# Patient Record
Sex: Female | Born: 1935 | Race: Black or African American | Hispanic: No | Marital: Married | Smoking: Never smoker
Health system: Southern US, Community
[De-identification: ages and names within clinical notes are randomized; demographics above are authoritative.]

## PROBLEM LIST (undated history)

## (undated) DIAGNOSIS — Z8679 Personal history of other diseases of the circulatory system: Principal | ICD-10-CM

## (undated) DIAGNOSIS — F329 Major depressive disorder, single episode, unspecified: Secondary | ICD-10-CM

## (undated) DIAGNOSIS — M7062 Trochanteric bursitis, left hip: Secondary | ICD-10-CM

## (undated) DIAGNOSIS — N189 Chronic kidney disease, unspecified: Secondary | ICD-10-CM

## (undated) DIAGNOSIS — M62838 Other muscle spasm: Secondary | ICD-10-CM

## (undated) DIAGNOSIS — I739 Peripheral vascular disease, unspecified: Secondary | ICD-10-CM

## (undated) DIAGNOSIS — E2749 Other adrenocortical insufficiency: Secondary | ICD-10-CM

## (undated) DIAGNOSIS — F32A Depression, unspecified: Secondary | ICD-10-CM

## (undated) DIAGNOSIS — G5600 Carpal tunnel syndrome, unspecified upper limb: Secondary | ICD-10-CM

## (undated) DIAGNOSIS — D649 Anemia, unspecified: Secondary | ICD-10-CM

## (undated) DIAGNOSIS — A048 Other specified bacterial intestinal infections: Secondary | ICD-10-CM

## (undated) DIAGNOSIS — I10 Essential (primary) hypertension: Secondary | ICD-10-CM

## (undated) DIAGNOSIS — M1990 Unspecified osteoarthritis, unspecified site: Secondary | ICD-10-CM

## (undated) DIAGNOSIS — E119 Type 2 diabetes mellitus without complications: Secondary | ICD-10-CM

## (undated) DIAGNOSIS — L89611 Pressure ulcer of right heel, stage 1: Secondary | ICD-10-CM

## (undated) HISTORY — DX: Other specified bacterial intestinal infections: A04.8

## (undated) HISTORY — DX: Type 2 diabetes mellitus without complications: E11.9

## (undated) HISTORY — PX: TUBAL LIGATION: SHX77

## (undated) HISTORY — DX: Chronic kidney disease, unspecified: N18.9

## (undated) HISTORY — DX: Other muscle spasm: M62.838

## (undated) HISTORY — DX: Pressure ulcer of right heel, stage 1: L89.611

## (undated) HISTORY — DX: Personal history of other diseases of the circulatory system: Z86.79

## (undated) HISTORY — DX: Peripheral vascular disease, unspecified: I73.9

## (undated) HISTORY — DX: Unspecified osteoarthritis, unspecified site: M19.90

## (undated) HISTORY — DX: Other adrenocortical insufficiency: E27.49

## (undated) HISTORY — DX: Trochanteric bursitis, left hip: M70.62

## (undated) HISTORY — DX: Essential (primary) hypertension: I10

## (undated) HISTORY — DX: Carpal tunnel syndrome, unspecified upper limb: G56.00

## (undated) HISTORY — DX: Major depressive disorder, single episode, unspecified: F32.9

## (undated) HISTORY — DX: Anemia, unspecified: D64.9

## (undated) HISTORY — DX: Depression, unspecified: F32.A

## (undated) HISTORY — PX: VAGINAL HYSTERECTOMY: SUR661

## (undated) HISTORY — PX: OTHER SURGICAL HISTORY: SHX169

---
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
## 1988-03-28 HISTORY — PX: CATARACT EXTRACTION, BILATERAL: SHX1313

## 2003-02-26 DIAGNOSIS — A048 Other specified bacterial intestinal infections: Secondary | ICD-10-CM

## 2003-02-26 HISTORY — DX: Other specified bacterial intestinal infections: A04.8

## 2003-03-19 ENCOUNTER — Emergency Department (HOSPITAL_COMMUNITY): Admission: EM | Admit: 2003-03-19 | Discharge: 2003-03-19 | Payer: Self-pay | Admitting: Emergency Medicine

## 2003-03-29 ENCOUNTER — Emergency Department (HOSPITAL_COMMUNITY): Admission: EM | Admit: 2003-03-29 | Discharge: 2003-03-29 | Payer: Self-pay | Admitting: Emergency Medicine

## 2003-03-29 HISTORY — PX: CARDIAC CATHETERIZATION: SHX172

## 2003-04-06 ENCOUNTER — Emergency Department (HOSPITAL_COMMUNITY): Admission: EM | Admit: 2003-04-06 | Discharge: 2003-04-06 | Payer: Self-pay | Admitting: Emergency Medicine

## 2003-04-17 ENCOUNTER — Inpatient Hospital Stay (HOSPITAL_COMMUNITY): Admission: EM | Admit: 2003-04-17 | Discharge: 2003-04-17 | Payer: Self-pay | Admitting: Emergency Medicine

## 2003-05-04 ENCOUNTER — Emergency Department (HOSPITAL_COMMUNITY): Admission: EM | Admit: 2003-05-04 | Discharge: 2003-05-04 | Payer: Self-pay | Admitting: Emergency Medicine

## 2003-05-27 HISTORY — PX: CARDIAC PACEMAKER PLACEMENT: SHX583

## 2003-05-30 ENCOUNTER — Ambulatory Visit (HOSPITAL_COMMUNITY): Admission: RE | Admit: 2003-05-30 | Discharge: 2003-05-30 | Payer: Self-pay | Admitting: Gastroenterology

## 2003-06-03 ENCOUNTER — Other Ambulatory Visit: Admission: RE | Admit: 2003-06-03 | Discharge: 2003-06-03 | Payer: Self-pay | Admitting: Obstetrics and Gynecology

## 2003-06-12 ENCOUNTER — Encounter: Admission: RE | Admit: 2003-06-12 | Discharge: 2003-06-12 | Payer: Self-pay | Admitting: *Deleted

## 2003-06-17 ENCOUNTER — Inpatient Hospital Stay (HOSPITAL_COMMUNITY): Admission: EM | Admit: 2003-06-17 | Discharge: 2003-06-19 | Payer: Self-pay | Admitting: Emergency Medicine

## 2003-07-27 HISTORY — PX: OOPHORECTOMY: SHX86

## 2003-08-05 ENCOUNTER — Encounter (INDEPENDENT_AMBULATORY_CARE_PROVIDER_SITE_OTHER): Payer: Self-pay | Admitting: *Deleted

## 2003-08-05 ENCOUNTER — Inpatient Hospital Stay (HOSPITAL_COMMUNITY): Admission: RE | Admit: 2003-08-05 | Discharge: 2003-08-07 | Payer: Self-pay | Admitting: Obstetrics and Gynecology

## 2004-03-28 ENCOUNTER — Encounter: Payer: Self-pay | Admitting: Family Medicine

## 2004-03-28 LAB — CONVERTED CEMR LAB

## 2004-05-15 ENCOUNTER — Inpatient Hospital Stay (HOSPITAL_COMMUNITY): Admission: EM | Admit: 2004-05-15 | Discharge: 2004-05-16 | Payer: Self-pay | Admitting: Emergency Medicine

## 2004-07-01 IMAGING — CR DG ABDOMEN ACUTE W/ 1V CHEST
4 series · 4 of 4 positions shown · non-contrast
Comparison: Chest 04/17/2003.

CLINICAL DATA: Abdominal pain, diarrhea. 
 ACUTE ABDOMINAL SERIES WITH CHEST

[view not recorded (1 of 4)]
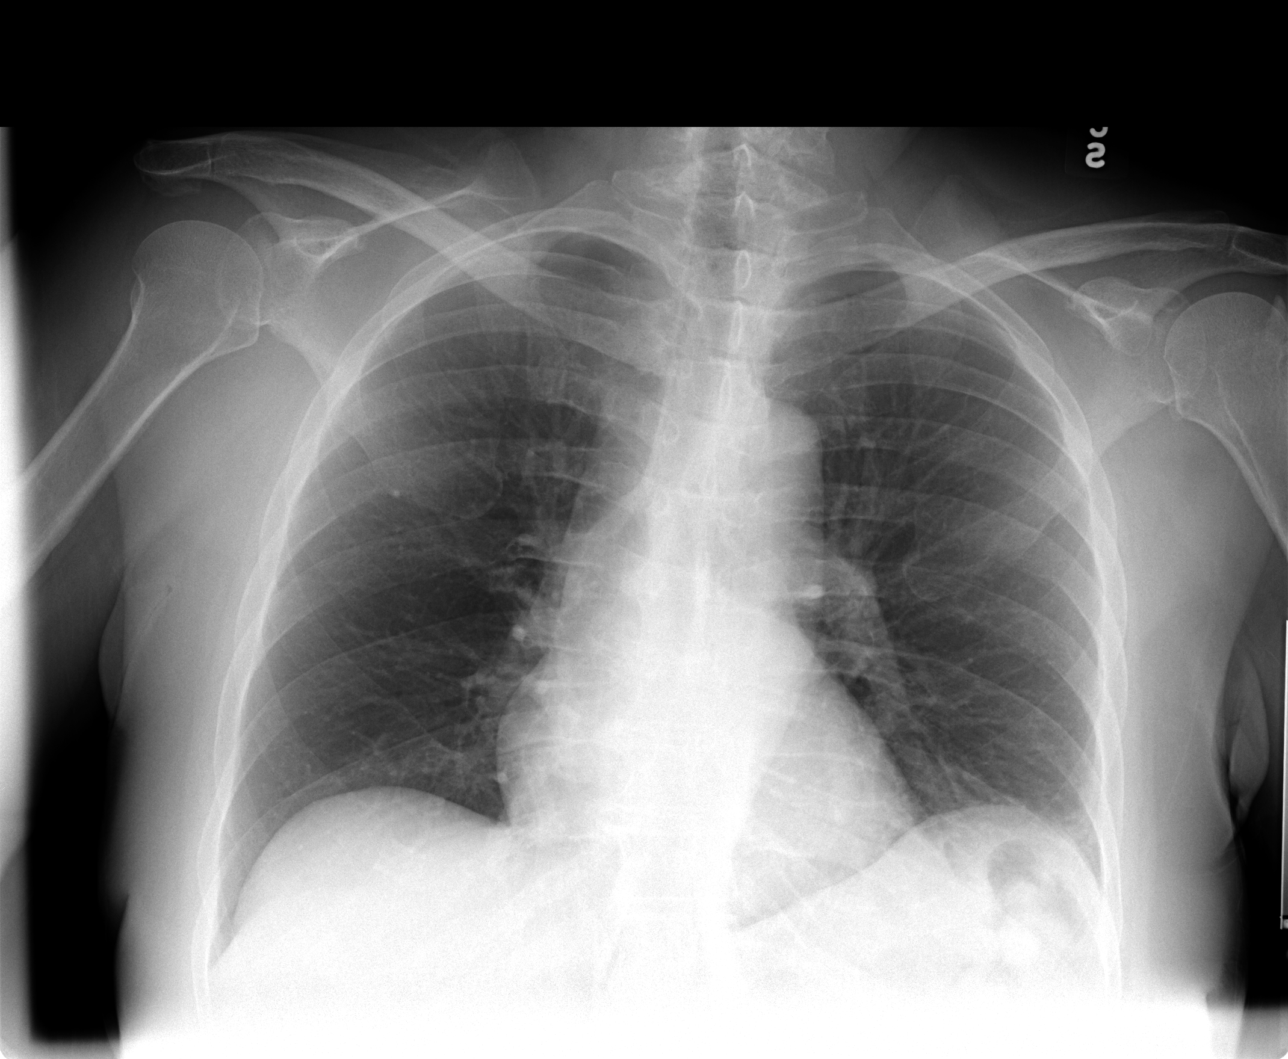

[view not recorded (2 of 4)]
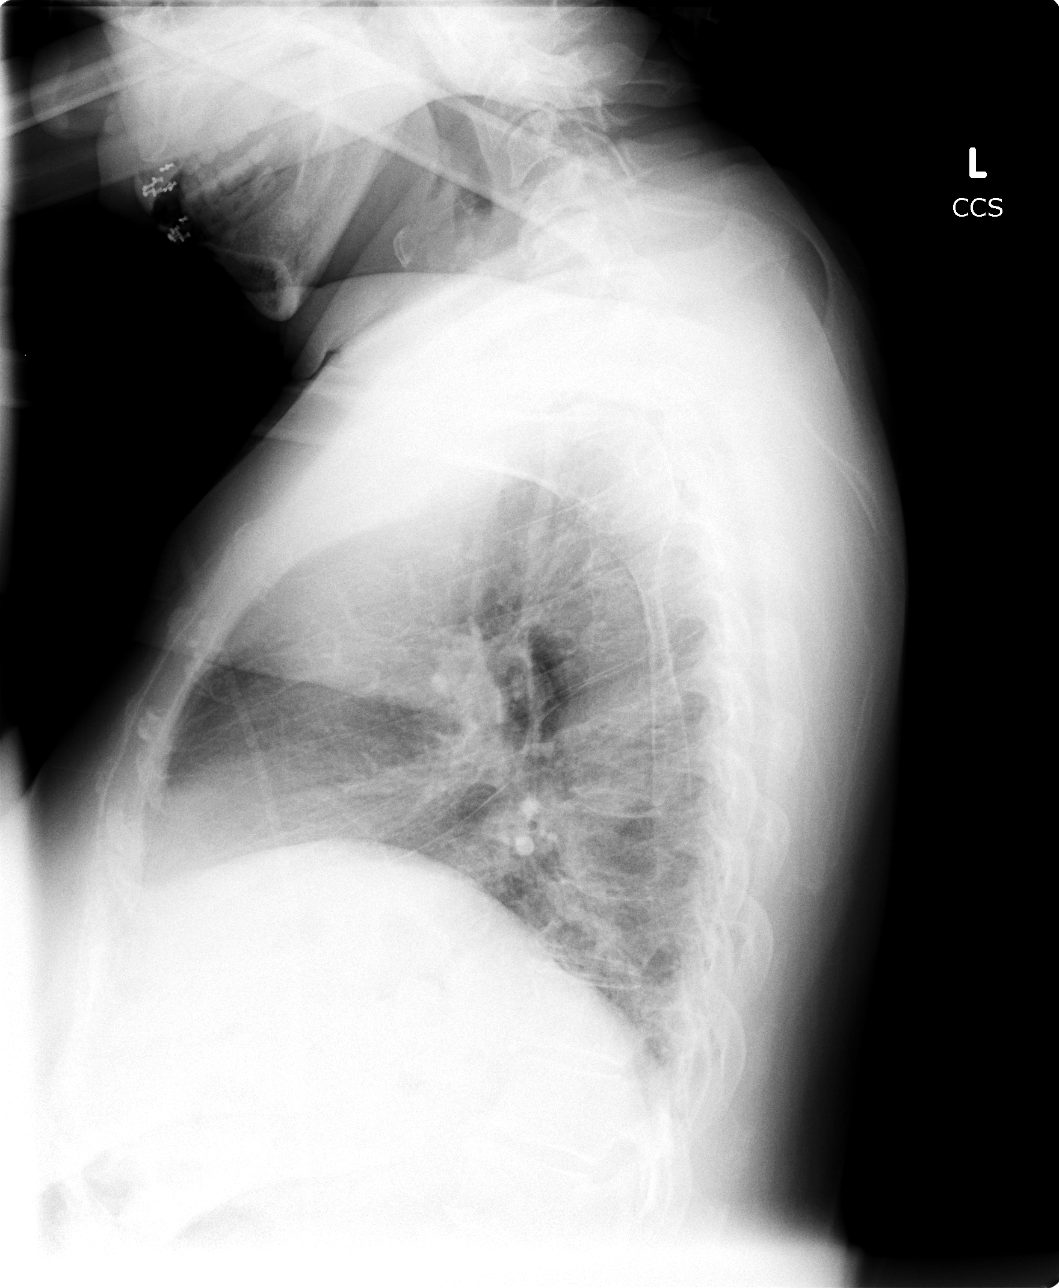

[view not recorded (3 of 4)]
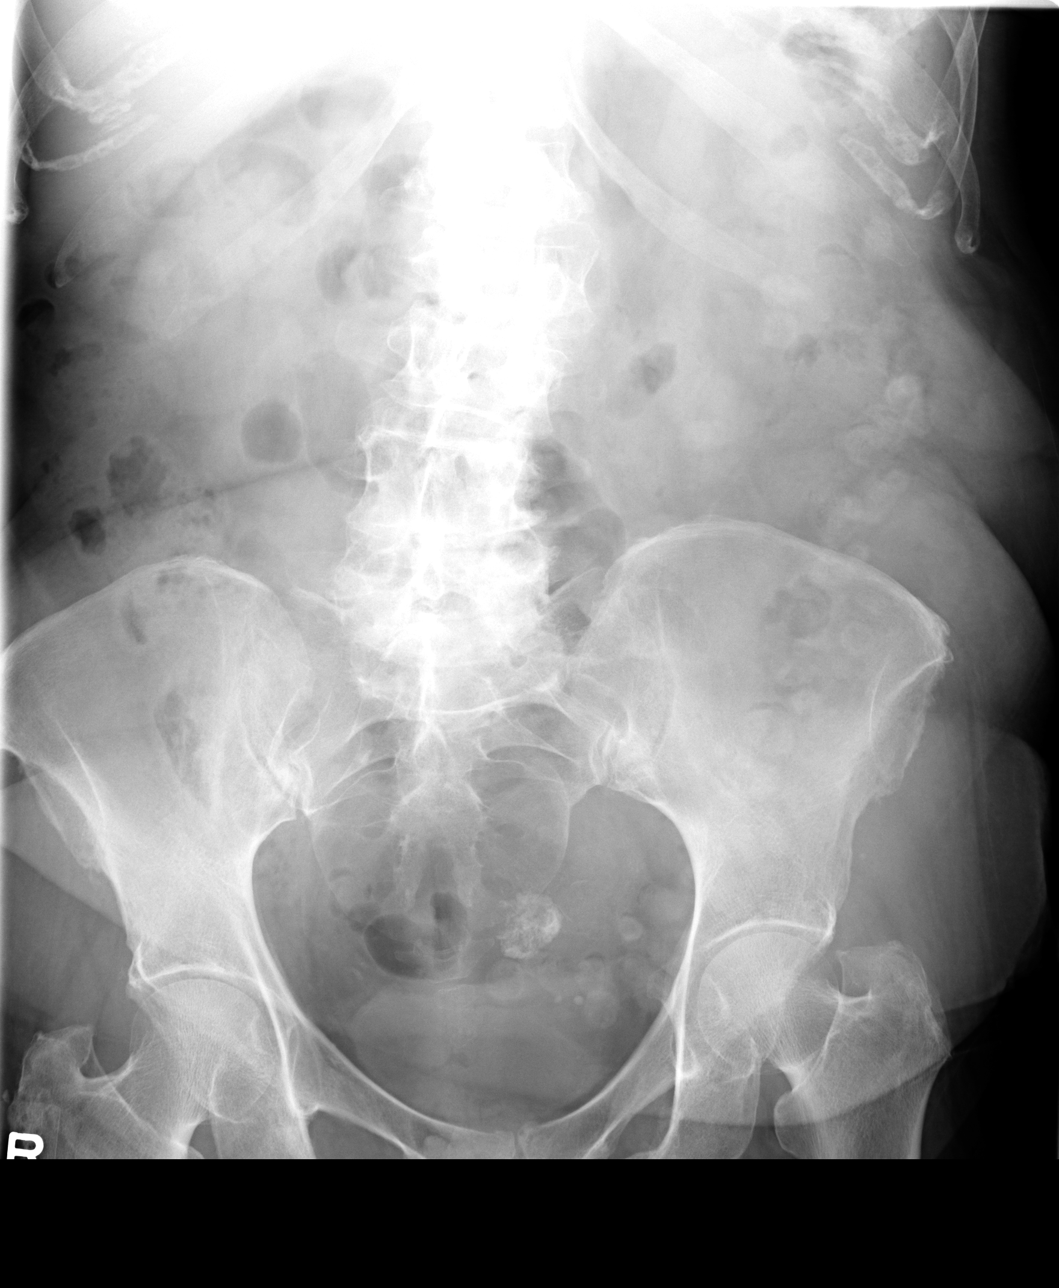

[view not recorded (4 of 4)]
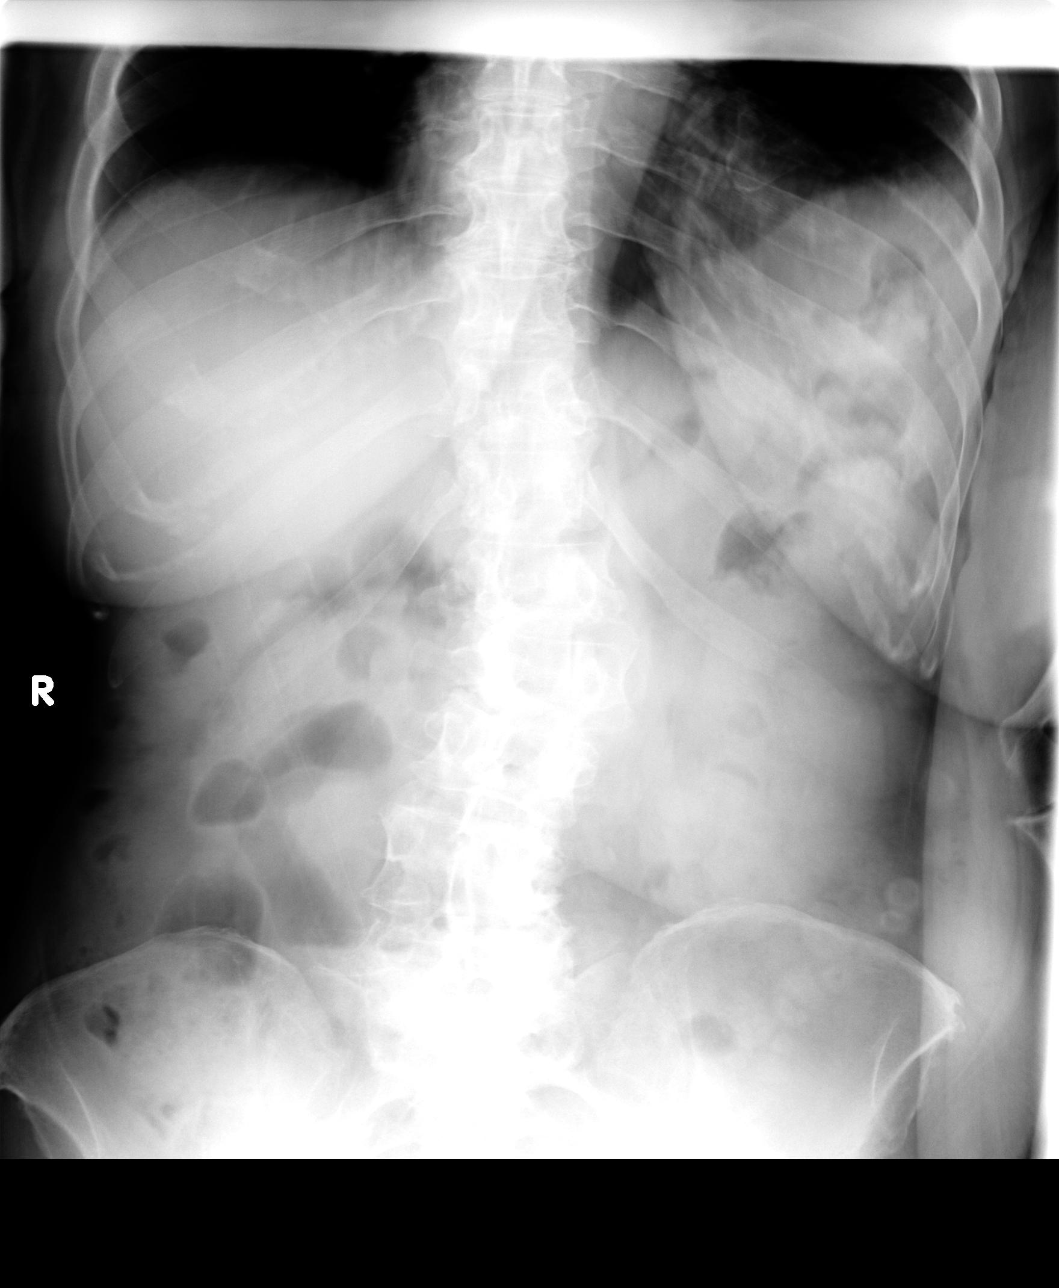

[4 of 4 positions shown; findings below may reference images not displayed]

Heart and mediastinal contours are within normal limits.  Minimal left basilar atelectasis is present.  
 There is a nonobstructive bowel gas pattern.   No free air.  Probable calcified fibroids in the pelvis.  Degenerative changes and scoliosis in the lumbar spine.  
 IMPRESSION
 No evidence of obstruction or free air. 
 Minimal left base atelectasis.

## 2004-08-14 IMAGING — CR DG CHEST 1V PORT
1 series · 1 of 1 positions shown · non-contrast
Comparison: none

CLINICAL DATA: Chest pain.  
 PORTABLE CHEST 
 Comparison 06/12/03:
 Heart and lungs normal in one view.  
 IMPRESSION
 No active disease.

[view not recorded]
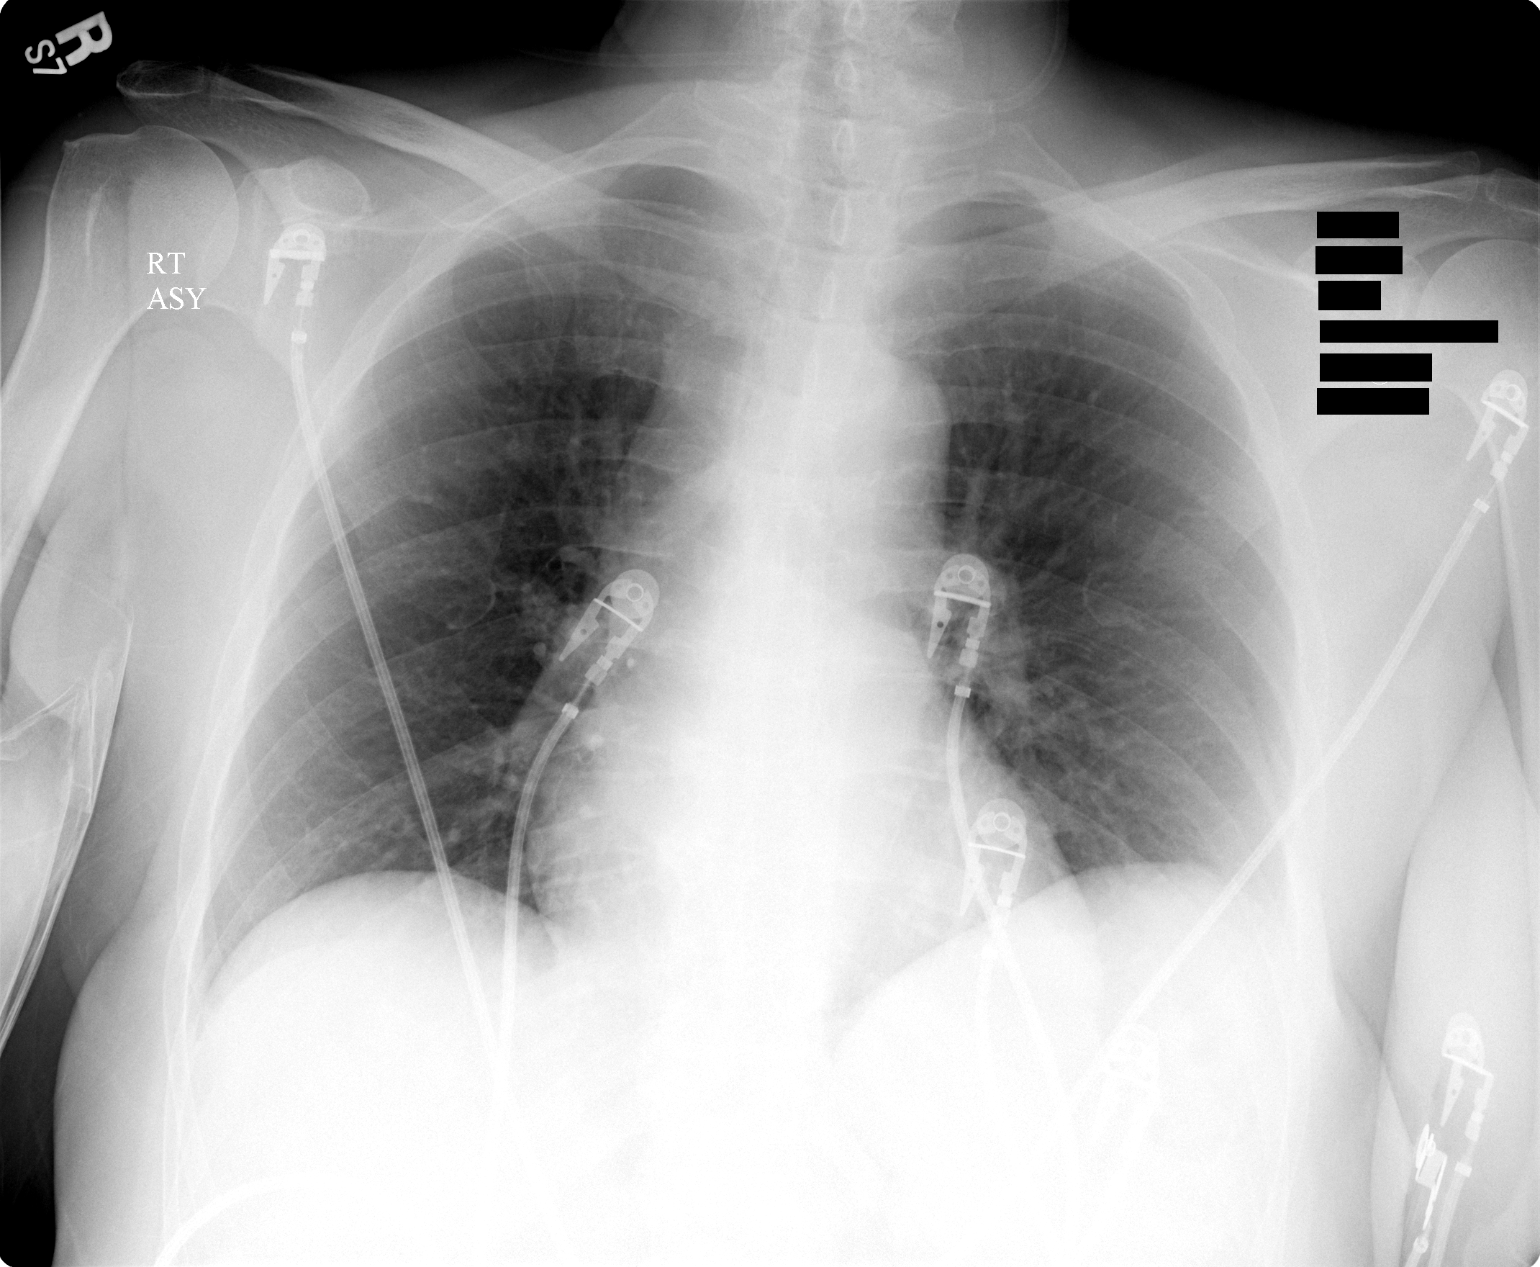

[1 of 1 positions shown; findings below may reference images not displayed]

## 2005-02-01 ENCOUNTER — Emergency Department (HOSPITAL_COMMUNITY): Admission: EM | Admit: 2005-02-01 | Discharge: 2005-02-01 | Payer: Self-pay | Admitting: Emergency Medicine

## 2005-03-09 ENCOUNTER — Emergency Department (HOSPITAL_COMMUNITY): Admission: EM | Admit: 2005-03-09 | Discharge: 2005-03-09 | Payer: Self-pay | Admitting: Emergency Medicine

## 2005-03-15 ENCOUNTER — Emergency Department (HOSPITAL_COMMUNITY): Admission: EM | Admit: 2005-03-15 | Discharge: 2005-03-15 | Payer: Self-pay | Admitting: Emergency Medicine

## 2005-05-19 ENCOUNTER — Encounter (INDEPENDENT_AMBULATORY_CARE_PROVIDER_SITE_OTHER): Payer: Self-pay | Admitting: Specialist

## 2005-05-19 ENCOUNTER — Ambulatory Visit (HOSPITAL_COMMUNITY): Admission: RE | Admit: 2005-05-19 | Discharge: 2005-05-19 | Payer: Self-pay | Admitting: Gastroenterology

## 2005-12-08 ENCOUNTER — Emergency Department (HOSPITAL_COMMUNITY): Admission: EM | Admit: 2005-12-08 | Discharge: 2005-12-08 | Payer: Self-pay | Admitting: Emergency Medicine

## 2006-06-08 ENCOUNTER — Inpatient Hospital Stay (HOSPITAL_COMMUNITY): Admission: EM | Admit: 2006-06-08 | Discharge: 2006-06-14 | Payer: Self-pay | Admitting: Emergency Medicine

## 2006-07-19 ENCOUNTER — Encounter: Payer: Self-pay | Admitting: Family Medicine

## 2006-07-19 ENCOUNTER — Ambulatory Visit: Payer: Self-pay | Admitting: Family Medicine

## 2006-07-19 DIAGNOSIS — I10 Essential (primary) hypertension: Secondary | ICD-10-CM

## 2006-07-19 DIAGNOSIS — M1710 Unilateral primary osteoarthritis, unspecified knee: Secondary | ICD-10-CM

## 2006-07-19 DIAGNOSIS — G309 Alzheimer's disease, unspecified: Secondary | ICD-10-CM

## 2006-07-19 DIAGNOSIS — F0280 Dementia in other diseases classified elsewhere without behavioral disturbance: Secondary | ICD-10-CM

## 2006-07-19 LAB — CONVERTED CEMR LAB
Albumin: 4.1 g/dL (ref 3.5–5.2)
BUN: 22 mg/dL (ref 6–23)
CO2: 29 meq/L (ref 19–32)
Calcium: 10 mg/dL (ref 8.4–10.5)
Chloride: 101 meq/L (ref 96–112)
Creatinine, Ser: 1.06 mg/dL (ref 0.40–1.20)
Glucose, Bld: 154 mg/dL — ABNORMAL HIGH (ref 70–99)
HCT: 37.8 %
Hemoglobin: 12.4 g/dL
Hgb A1c MFr Bld: 7 %
Potassium: 3.8 meq/L (ref 3.5–5.3)
WBC: 8.6 10*3/uL

## 2006-07-20 ENCOUNTER — Telehealth: Payer: Self-pay | Admitting: Family Medicine

## 2006-07-27 HISTORY — PX: TRABECULECTOMY: SHX107

## 2006-08-16 ENCOUNTER — Ambulatory Visit: Payer: Self-pay | Admitting: Family Medicine

## 2006-08-18 ENCOUNTER — Encounter: Payer: Self-pay | Admitting: Family Medicine

## 2006-08-18 LAB — CONVERTED CEMR LAB

## 2006-08-22 ENCOUNTER — Telehealth: Payer: Self-pay | Admitting: Family Medicine

## 2006-08-22 ENCOUNTER — Encounter: Payer: Self-pay | Admitting: Family Medicine

## 2006-08-23 ENCOUNTER — Encounter: Payer: Self-pay | Admitting: Family Medicine

## 2006-09-19 ENCOUNTER — Ambulatory Visit: Payer: Self-pay | Admitting: Family Medicine

## 2006-09-19 DIAGNOSIS — H409 Unspecified glaucoma: Secondary | ICD-10-CM | POA: Insufficient documentation

## 2006-09-19 LAB — CONVERTED CEMR LAB: Hgb A1c MFr Bld: 7.2 %

## 2006-10-02 ENCOUNTER — Telehealth (INDEPENDENT_AMBULATORY_CARE_PROVIDER_SITE_OTHER): Payer: Self-pay | Admitting: Family Medicine

## 2006-10-05 ENCOUNTER — Telehealth: Payer: Self-pay | Admitting: *Deleted

## 2006-10-06 ENCOUNTER — Encounter: Payer: Self-pay | Admitting: *Deleted

## 2006-10-06 ENCOUNTER — Ambulatory Visit: Payer: Self-pay | Admitting: Family Medicine

## 2006-10-06 LAB — CONVERTED CEMR LAB
CO2: 25 meq/L (ref 19–32)
Calcium: 9.6 mg/dL (ref 8.4–10.5)
Creatinine, Ser: 1.06 mg/dL (ref 0.40–1.20)
Sodium: 137 meq/L (ref 135–145)

## 2006-10-17 ENCOUNTER — Ambulatory Visit: Payer: Self-pay | Admitting: Family Medicine

## 2007-01-02 ENCOUNTER — Ambulatory Visit: Payer: Self-pay | Admitting: Family Medicine

## 2007-01-02 DIAGNOSIS — G5620 Lesion of ulnar nerve, unspecified upper limb: Secondary | ICD-10-CM

## 2007-01-02 LAB — CONVERTED CEMR LAB: Hgb A1c MFr Bld: 7 %

## 2007-01-09 ENCOUNTER — Telehealth (INDEPENDENT_AMBULATORY_CARE_PROVIDER_SITE_OTHER): Payer: Self-pay | Admitting: Family Medicine

## 2007-01-18 ENCOUNTER — Encounter: Admission: RE | Admit: 2007-01-18 | Discharge: 2007-02-08 | Payer: Self-pay | Admitting: Family Medicine

## 2007-02-08 ENCOUNTER — Encounter: Payer: Self-pay | Admitting: Family Medicine

## 2007-03-13 ENCOUNTER — Ambulatory Visit: Payer: Self-pay

## 2007-03-23 ENCOUNTER — Encounter: Payer: Self-pay | Admitting: Family Medicine

## 2007-03-23 ENCOUNTER — Ambulatory Visit: Payer: Self-pay | Admitting: Family Medicine

## 2007-03-23 LAB — CONVERTED CEMR LAB
BUN: 17 mg/dL (ref 6–23)
Chloride: 102 meq/L (ref 96–112)
Potassium: 4 meq/L (ref 3.5–5.3)
Pro B Natriuretic peptide (BNP): 9 pg/mL (ref 0.0–100.0)

## 2007-03-26 ENCOUNTER — Encounter: Payer: Self-pay | Admitting: Family Medicine

## 2007-06-06 ENCOUNTER — Encounter: Payer: Self-pay | Admitting: Family Medicine

## 2007-06-12 ENCOUNTER — Encounter: Payer: Self-pay | Admitting: Family Medicine

## 2007-08-14 ENCOUNTER — Encounter: Payer: Self-pay | Admitting: Family Medicine

## 2007-08-28 ENCOUNTER — Ambulatory Visit: Payer: Self-pay | Admitting: Family Medicine

## 2007-09-25 ENCOUNTER — Telehealth: Payer: Self-pay | Admitting: Family Medicine

## 2007-10-16 ENCOUNTER — Ambulatory Visit: Payer: Self-pay | Admitting: Family Medicine

## 2007-10-16 LAB — CONVERTED CEMR LAB
ALT: 11 units/L (ref 0–35)
AST: 13 units/L (ref 0–37)
Albumin: 4.1 g/dL (ref 3.5–5.2)
BUN: 27 mg/dL — ABNORMAL HIGH (ref 6–23)
CO2: 26 meq/L (ref 19–32)
Calcium: 9.6 mg/dL (ref 8.4–10.5)
Chloride: 103 meq/L (ref 96–112)
HCT: 36.4 % (ref 36.0–46.0)
Hemoglobin: 11 g/dL — ABNORMAL LOW (ref 12.0–15.0)
Hgb A1c MFr Bld: 8.3 %
Platelets: 254 10*3/uL (ref 150–400)
Potassium: 4 meq/L (ref 3.5–5.3)
RDW: 17.1 % — ABNORMAL HIGH (ref 11.5–15.5)
TSH: 4.418 microintl units/mL (ref 0.350–4.50)
WBC: 7.9 10*3/uL (ref 4.0–10.5)

## 2007-10-17 ENCOUNTER — Encounter: Payer: Self-pay | Admitting: Family Medicine

## 2007-11-26 ENCOUNTER — Telehealth: Payer: Self-pay | Admitting: Family Medicine

## 2007-12-03 ENCOUNTER — Encounter: Payer: Self-pay | Admitting: Family Medicine

## 2007-12-04 ENCOUNTER — Ambulatory Visit: Payer: Self-pay | Admitting: Family Medicine

## 2007-12-04 DIAGNOSIS — D509 Iron deficiency anemia, unspecified: Secondary | ICD-10-CM

## 2007-12-04 LAB — CONVERTED CEMR LAB
HCT: 34.7 % — ABNORMAL LOW (ref 36.0–46.0)
MCHC: 30.5 g/dL (ref 30.0–36.0)
MCV: 72.1 fL — ABNORMAL LOW (ref 78.0–100.0)
RBC: 4.81 M/uL (ref 3.87–5.11)
WBC: 7.5 10*3/uL (ref 4.0–10.5)

## 2007-12-05 ENCOUNTER — Encounter: Payer: Self-pay | Admitting: Family Medicine

## 2008-01-29 ENCOUNTER — Telehealth: Payer: Self-pay | Admitting: Family Medicine

## 2008-02-07 ENCOUNTER — Encounter (INDEPENDENT_AMBULATORY_CARE_PROVIDER_SITE_OTHER): Payer: Self-pay | Admitting: *Deleted

## 2008-02-12 ENCOUNTER — Ambulatory Visit: Payer: Self-pay | Admitting: Family Medicine

## 2008-02-12 LAB — CONVERTED CEMR LAB
AST: 16 units/L (ref 0–37)
Albumin: 4.3 g/dL (ref 3.5–5.2)
BUN: 19 mg/dL (ref 6–23)
CO2: 25 meq/L (ref 19–32)
Calcium: 10 mg/dL (ref 8.4–10.5)
Chloride: 104 meq/L (ref 96–112)
Glucose, Bld: 136 mg/dL — ABNORMAL HIGH (ref 70–99)
Hemoglobin: 10.6 g/dL — ABNORMAL LOW (ref 12.0–15.0)
Potassium: 3.5 meq/L (ref 3.5–5.3)
RBC: 4.66 M/uL (ref 3.87–5.11)
WBC: 6.2 10*3/uL (ref 4.0–10.5)

## 2008-02-26 ENCOUNTER — Encounter: Payer: Self-pay | Admitting: Family Medicine

## 2008-03-03 ENCOUNTER — Telehealth: Payer: Self-pay | Admitting: Family Medicine

## 2008-04-10 ENCOUNTER — Encounter: Payer: Self-pay | Admitting: Family Medicine

## 2008-04-22 ENCOUNTER — Ambulatory Visit: Payer: Self-pay | Admitting: Family Medicine

## 2008-06-02 ENCOUNTER — Ambulatory Visit (HOSPITAL_COMMUNITY): Admission: RE | Admit: 2008-06-02 | Discharge: 2008-06-02 | Payer: Self-pay | Admitting: Family Medicine

## 2008-06-03 ENCOUNTER — Ambulatory Visit: Payer: Self-pay | Admitting: Family Medicine

## 2008-06-03 LAB — CONVERTED CEMR LAB: Glucose, Bld: 123 mg/dL

## 2008-06-06 LAB — CONVERTED CEMR LAB
CO2: 24 meq/L (ref 19–32)
Calcium: 10.1 mg/dL (ref 8.4–10.5)
Creatinine, Ser: 2.07 mg/dL — ABNORMAL HIGH (ref 0.40–1.20)

## 2008-06-09 ENCOUNTER — Telehealth: Payer: Self-pay | Admitting: Family Medicine

## 2008-06-12 ENCOUNTER — Ambulatory Visit: Payer: Self-pay | Admitting: Family Medicine

## 2008-06-12 ENCOUNTER — Encounter: Admission: RE | Admit: 2008-06-12 | Discharge: 2008-06-12 | Payer: Self-pay | Admitting: Family Medicine

## 2008-06-12 LAB — CONVERTED CEMR LAB
BUN: 12 mg/dL (ref 6–23)
Chloride: 109 meq/L (ref 96–112)
Hemoglobin: 10.3 g/dL — ABNORMAL LOW (ref 12.0–15.0)
Platelets: 201 10*3/uL (ref 150–400)
Potassium: 3.8 meq/L (ref 3.5–5.3)
RDW: 15.4 % (ref 11.5–15.5)

## 2008-06-17 ENCOUNTER — Telehealth: Payer: Self-pay | Admitting: Family Medicine

## 2008-06-25 ENCOUNTER — Telehealth: Payer: Self-pay | Admitting: Family Medicine

## 2008-07-15 ENCOUNTER — Ambulatory Visit: Payer: Self-pay | Admitting: Family Medicine

## 2008-07-15 DIAGNOSIS — G20 Parkinson's disease: Secondary | ICD-10-CM

## 2008-07-29 ENCOUNTER — Telehealth: Payer: Self-pay | Admitting: Family Medicine

## 2008-08-19 ENCOUNTER — Ambulatory Visit: Payer: Self-pay | Admitting: Family Medicine

## 2008-08-19 ENCOUNTER — Encounter: Payer: Self-pay | Admitting: Family Medicine

## 2008-08-19 ENCOUNTER — Inpatient Hospital Stay (HOSPITAL_COMMUNITY): Admission: EM | Admit: 2008-08-19 | Discharge: 2008-08-22 | Payer: Self-pay | Admitting: Emergency Medicine

## 2008-08-22 ENCOUNTER — Encounter: Payer: Self-pay | Admitting: Family Medicine

## 2008-08-23 ENCOUNTER — Emergency Department (HOSPITAL_COMMUNITY): Admission: EM | Admit: 2008-08-23 | Discharge: 2008-08-23 | Payer: Self-pay | Admitting: Emergency Medicine

## 2008-08-23 ENCOUNTER — Telehealth: Payer: Self-pay | Admitting: Family Medicine

## 2008-09-03 ENCOUNTER — Encounter: Payer: Self-pay | Admitting: Family Medicine

## 2008-09-17 ENCOUNTER — Encounter: Payer: Self-pay | Admitting: Family Medicine

## 2008-09-23 ENCOUNTER — Encounter: Payer: Self-pay | Admitting: Family Medicine

## 2008-09-24 ENCOUNTER — Encounter: Payer: Self-pay | Admitting: Family Medicine

## 2008-10-04 ENCOUNTER — Telehealth: Payer: Self-pay | Admitting: Sports Medicine

## 2008-10-15 DIAGNOSIS — Z593 Problems related to living in residential institution: Secondary | ICD-10-CM

## 2008-10-18 ENCOUNTER — Encounter: Payer: Self-pay | Admitting: Family Medicine

## 2008-10-29 ENCOUNTER — Telehealth: Payer: Self-pay | Admitting: Family Medicine

## 2008-11-12 ENCOUNTER — Ambulatory Visit: Payer: Self-pay | Admitting: Family Medicine

## 2008-11-12 ENCOUNTER — Encounter: Payer: Self-pay | Admitting: Family Medicine

## 2008-11-17 ENCOUNTER — Encounter (INDEPENDENT_AMBULATORY_CARE_PROVIDER_SITE_OTHER): Payer: Self-pay | Admitting: Pharmacist

## 2008-11-18 ENCOUNTER — Encounter: Payer: Self-pay | Admitting: Family Medicine

## 2008-11-18 LAB — CONVERTED CEMR LAB: Hgb A1c MFr Bld: 7 %

## 2008-11-19 ENCOUNTER — Encounter: Payer: Self-pay | Admitting: Family Medicine

## 2008-12-24 ENCOUNTER — Encounter: Payer: Self-pay | Admitting: Family Medicine

## 2009-01-14 ENCOUNTER — Ambulatory Visit: Payer: Self-pay | Admitting: Family Medicine

## 2009-02-11 ENCOUNTER — Encounter (INDEPENDENT_AMBULATORY_CARE_PROVIDER_SITE_OTHER): Payer: Self-pay | Admitting: Pharmacist

## 2009-02-23 ENCOUNTER — Encounter: Payer: Self-pay | Admitting: Family Medicine

## 2009-02-27 ENCOUNTER — Encounter: Payer: Self-pay | Admitting: Family Medicine

## 2009-02-27 LAB — CONVERTED CEMR LAB: Hgb A1c MFr Bld: 5.4 %

## 2009-03-12 ENCOUNTER — Encounter: Payer: Self-pay | Admitting: Family Medicine

## 2009-03-12 LAB — CONVERTED CEMR LAB
Hemoglobin: 9.5 g/dL
Hgb A1c MFr Bld: 5.4 %

## 2009-04-15 ENCOUNTER — Encounter (INDEPENDENT_AMBULATORY_CARE_PROVIDER_SITE_OTHER): Payer: Self-pay | Admitting: Pharmacist

## 2009-04-29 ENCOUNTER — Ambulatory Visit: Payer: Self-pay | Admitting: Family Medicine

## 2009-04-29 ENCOUNTER — Encounter: Payer: Self-pay | Admitting: Family Medicine

## 2009-05-04 ENCOUNTER — Encounter: Payer: Self-pay | Admitting: Family Medicine

## 2009-05-04 DIAGNOSIS — R634 Abnormal weight loss: Secondary | ICD-10-CM

## 2009-05-13 ENCOUNTER — Encounter (INDEPENDENT_AMBULATORY_CARE_PROVIDER_SITE_OTHER): Payer: Self-pay | Admitting: Pharmacist

## 2009-05-19 ENCOUNTER — Encounter: Payer: Self-pay | Admitting: Family Medicine

## 2009-05-19 DIAGNOSIS — E119 Type 2 diabetes mellitus without complications: Secondary | ICD-10-CM

## 2009-07-01 ENCOUNTER — Encounter: Payer: Self-pay | Admitting: Family Medicine

## 2009-07-21 ENCOUNTER — Encounter: Payer: Self-pay | Admitting: Family Medicine

## 2009-08-04 ENCOUNTER — Encounter: Payer: Self-pay | Admitting: Family Medicine

## 2009-08-12 ENCOUNTER — Encounter (INDEPENDENT_AMBULATORY_CARE_PROVIDER_SITE_OTHER): Payer: Self-pay | Admitting: Pharmacist

## 2009-08-28 ENCOUNTER — Encounter (INDEPENDENT_AMBULATORY_CARE_PROVIDER_SITE_OTHER): Payer: Self-pay | Admitting: Pharmacist

## 2009-08-28 LAB — CONVERTED CEMR LAB: Hgb A1c MFr Bld: 6 %

## 2009-09-16 ENCOUNTER — Ambulatory Visit: Payer: Self-pay | Admitting: Family Medicine

## 2009-09-16 ENCOUNTER — Encounter (INDEPENDENT_AMBULATORY_CARE_PROVIDER_SITE_OTHER): Payer: Self-pay | Admitting: Pharmacist

## 2009-09-18 ENCOUNTER — Encounter: Payer: Self-pay | Admitting: Family Medicine

## 2009-10-04 ENCOUNTER — Telehealth: Payer: Self-pay | Admitting: Family Medicine

## 2009-10-12 ENCOUNTER — Encounter: Payer: Self-pay | Admitting: Family Medicine

## 2009-10-20 ENCOUNTER — Encounter: Payer: Self-pay | Admitting: Family Medicine

## 2009-11-04 ENCOUNTER — Encounter: Payer: Self-pay | Admitting: Family Medicine

## 2009-11-19 ENCOUNTER — Encounter (INDEPENDENT_AMBULATORY_CARE_PROVIDER_SITE_OTHER): Payer: Self-pay | Admitting: Pharmacist

## 2009-11-25 ENCOUNTER — Encounter (INDEPENDENT_AMBULATORY_CARE_PROVIDER_SITE_OTHER): Payer: Self-pay | Admitting: Pharmacist

## 2009-12-24 ENCOUNTER — Encounter (INDEPENDENT_AMBULATORY_CARE_PROVIDER_SITE_OTHER): Payer: Self-pay | Admitting: Pharmacist

## 2009-12-25 ENCOUNTER — Encounter: Payer: Self-pay | Admitting: Family Medicine

## 2009-12-25 LAB — CONVERTED CEMR LAB: Hgb A1c MFr Bld: 6.1 %

## 2010-01-07 ENCOUNTER — Encounter: Payer: Self-pay | Admitting: Family Medicine

## 2010-01-07 LAB — CONVERTED CEMR LAB
Hemoglobin: 9.6 g/dL
Platelets: 217 10*3/uL

## 2010-01-20 ENCOUNTER — Encounter (INDEPENDENT_AMBULATORY_CARE_PROVIDER_SITE_OTHER): Payer: Self-pay | Admitting: Pharmacist

## 2010-01-20 ENCOUNTER — Ambulatory Visit: Payer: Self-pay | Admitting: Family Medicine

## 2010-01-20 DIAGNOSIS — M623 Immobility syndrome (paraplegic): Secondary | ICD-10-CM

## 2010-02-02 ENCOUNTER — Encounter: Payer: Self-pay | Admitting: Family Medicine

## 2010-02-16 ENCOUNTER — Telehealth: Payer: Self-pay | Admitting: Family Medicine

## 2010-03-03 ENCOUNTER — Encounter: Payer: Self-pay | Admitting: Family Medicine

## 2010-03-26 ENCOUNTER — Encounter: Payer: Self-pay | Admitting: Family Medicine

## 2010-04-18 ENCOUNTER — Encounter: Payer: Self-pay | Admitting: Family Medicine

## 2010-04-27 ENCOUNTER — Encounter: Payer: Self-pay | Admitting: Family Medicine

## 2010-04-27 NOTE — Miscellaneous (Signed)
Summary: Med Update - Rx  Clinical Lists Changes  Medications: Added new medication of METFORMIN HCL 1000 MG TABS (METFORMIN HCL) once daily - Signed Rx of METFORMIN HCL 1000 MG TABS (METFORMIN HCL) once daily;  #1 x 0;  Signed;  Entered by: Christian Mate D;  Authorized by: Madelon Lips Pharm D;  Method used: Historical    Prescriptions: METFORMIN HCL 1000 MG TABS (METFORMIN HCL) once daily  #1 x 0   Entered and Authorized by:   Christian Mate D   Signed by:   Madelon Lips Pharm D on 01/20/2010   Method used:   Historical   RxID:   [id number]

## 2010-04-27 NOTE — Miscellaneous (Signed)
Summary: Med Update - Rx  Clinical Lists Changes  Medications: Removed medication of CARTIA XT 240 MG  CP24 (DILTIAZEM HCL COATED BEADS) Take one tablet daily

## 2010-04-27 NOTE — Assessment & Plan Note (Signed)
Summary: NH visit   Primary Care Rande Roylance:  Jamie Brookes MD   History of Present Illness: No verbal response. No problems reported  Allergies: 1)  ! Penicillin 2)  Aricept (Donepezil Hcl) 3)  Norvasc  Physical Exam  General:  NAD, sitting in chair peacefully Lungs:  Normal respiratory effort, chest expands symmetrically. Lungs are clear to auscultation, no crackles or wheezes. Heart:  Normal rate and regular rhythm. S1 and S2 normal without gallop, murmur, click, rub or other extra sounds. Extremities:  No edema Neurologic:  After a time she opened her eyes. No verbal reponse. Masked facies. Rigid arms wearing hand splints.    Impression & Recommendations:  Problem # 1:  PARKINSONISM (ICD-332.0) poor response to treatment Orders: Gurnoor Ursua Misc Charge- [facility] (Misc)  Problem # 2:  ALZHEIMER'S DISEASE (ICD-331.0) Continues to decline Orders: Caylei Sperry Misc Charge- [facility] (Misc)  Problem # 3:  HYPERTENSION, BENIGN (ICD-401.1) Mostly well controlled. Her updated medication list for this problem includes:    Lisinopril 10 Mg Tabs (Lisinopril) ..... Once daily  Orders: Jondavid Schreier Misc Charge- [facility] (Misc)  Complete Medication List: 1)  Adult Aspirin Ec Low Strength 81 Mg Tbec (Aspirin) .Marland Kitchen.. 1 tab by mouth daily 2)  Miralax Powd (Polyethylene glycol 3350) .Marland Kitchen.. 17g in 8 oz water daily 3)  Tylenol 8 Hour 650 Mg Tbcr (Acetaminophen) .... Take 1 tabs twice daily 4)  Multivitamins Tabs (Multiple vitamin) .... One daily 5)  Timoptic-xe 0.5 % Solg (Timolol maleate) .... One drop both eys daily 6)  Xalatan 0.005 % Soln (Latanoprost) .Marland Kitchen.. 1 drop both eyes daily 7)  Sinemet 25-250 Mg Tabs (Carbidopa-levodopa) .... Three times a day prior to meals 8)  Lisinopril 10 Mg Tabs (Lisinopril) .... Once daily 9)  Ergocalciferol 50000 Unit Caps (Ergocalciferol) .... Once monthly 10)  Namenda 10 Mg Tabs (Memantine hcl) .... Two times a day   Orders Added: 1)  Loc Feinstein Misc Charge- [facility] [Misc]

## 2010-04-27 NOTE — Miscellaneous (Signed)
Summary: NH visit   Primary Care Provider:  Jamie Brookes MD   History of Present Illness: Continues to decline. Less interactive. Her daughter, RP, told Levada Dy that the family would not want a PEG tube. Continues to be full code status.   Allergies: 1)  ! Penicillin 2)  Aricept (Donepezil Hcl) 3)  Norvasc  Physical Exam  General:  underweight appearing, disheveled, uncomfortable-appearing, and poorly cooperative to examination.   Heart:  Normal rate and regular rhythm. S1 and S2 normal Grade  3 /6 systolic ejection murmur.   Msk:  Grimaces with her arms are moved Neurologic:  + glabellar reflex Rigid extremities Psych:  poor eye contact and memory impairment.  No verbal response.    Impression & Recommendations:  Problem # 1:  PARKINSONISM (ICD-332.0)  will try increased Sinemet watching for agitation  [facility]  Problem # 2:  WEIGHT LOSS (ICD-783.21)  No further loss this month  [facility]  Problem # 3:  HYPERTENSION, BENIGN (ICD-401.1) Assessment: Improved  Her updated medication list for this problem includes:    Lisinopril 20 Mg Tabs (Lisinopril) .Marland Kitchen... 1 tab by mouth daily  Orders: Provider Misc Charge- [facility] (Misc)  Problem # 4:  DIABETES MELLITUS, TYPE II, WITH RENAL COMPLICATIONS (ICD-250.40) Marina K ordered an A1c this month. Her updated medication list for this problem includes:    Lisinopril 20 Mg Tabs (Lisinopril) .Marland Kitchen... 1 tab by mouth daily    Adult Aspirin Ec Low Strength 81 Mg Tbec (Aspirin) .Marland Kitchen... 1 tab by mouth daily    Metformin Hcl 1000 Mg Tabs (Metformin hcl) .Marland Kitchen... Take one tab in a.m.  Complete Medication List: 1)  Lisinopril 20 Mg Tabs (Lisinopril) .Marland Kitchen.. 1 tab by mouth daily 2)  Adult Aspirin Ec Low Strength 81 Mg Tbec (Aspirin) .Marland Kitchen.. 1 tab by mouth daily 3)  Metformin Hcl 1000 Mg Tabs (Metformin hcl) .... Take one tab in a.m. 4)  Miralax Powd (Polyethylene glycol 3350) .Marland Kitchen..  17g in 8 oz water daily 5)  Tylenol 8 Hour 650 Mg Tbcr (Acetaminophen) .... Take 1 tabs twice daily 6)  Multivitamins Tabs (Multiple vitamin) .... One daily 7)  Timoptic-xe 0.5 % Solg (Timolol maleate) .... One drop both eys daily 8)  Xalatan 0.005 % Soln (Latanoprost) .Marland Kitchen.. 1 drop both eyes daily 9)  Namenda 10 Mg Tabs (Memantine hcl) .... Two times a day 10)  Sarna Sensitive 1 % Lotn (Pramoxine hcl) .... Apply to affected area two times a day 11)  Eucerin Crea (Skin protectants, misc.) .... Apply two times a day as needed to affected areas 12)  Sinemet 25-250 Mg Tabs (Carbidopa-levodopa) .... Three times a day prior to meals      Prevention & Chronic Care Immunizations   Influenza vaccine: given  (04/22/2008)   Influenza vaccine due: 04/22/2009    Tetanus booster: 05/27/2003: given   Tetanus booster due: 05/26/2013    Pneumococcal vaccine: given  (12/04/2007)   Pneumococcal vaccine due: None    H. zoster vaccine: Not documented  Colorectal Screening   Hemoccult: Not documented   Hemoccult action/deferral: Ordered  (08/04/2009)    Colonoscopy: Not documented   Colonoscopy due: Not Indicated  Other Screening   Pap smear: done  (08/18/2006)   Pap smear due: Not Indicated    Mammogram: BI-RADS CATEGORY 2:  Benign finding(s).^MM DIGITAL DIAG LTD L  (06/12/2008)   Mammogram action/deferral: Deferred  (08/04/2009)   Mammogram due: 06/02/2009  DXA bone density scan: Not documented   DXA bone density action/deferral: Deferred  (08/04/2009)   Smoking status: never  (07/15/2008)  Diabetes Mellitus   HgbA1C: 5.4  (03/12/2009)   HgbA1C action/deferral: Not indicated  (08/04/2009)   Hemoglobin A1C due: 09/03/2008    Eye exam: normal  (03/29/2007)   Diabetic eye exam action/deferral: Not indicated  (08/04/2009)   Eye exam due: 03/28/2008    Foot exam: yes  (07/15/2008)   Foot exam action/deferral: Not indicated   High risk foot: Not documented   Foot care education:  completed  (04/22/2008)   Foot exam due: 04/22/2009    Urine microalbumin/creatinine ratio: Not documented   Urine microalbumin action/deferral: Not indicated    Diabetes flowsheet reviewed?: Yes   Progress toward A1C goal: Unchanged  Lipids   Total Cholesterol: Not documented   Lipid panel action/deferral: Not indicated   LDL: Not documented   LDL Direct: 139  (10/16/2007)   HDL: Not documented   Triglycerides: Not documented  Hypertension   Last Blood Pressure: 132 / 76  (08/04/2009)   Serum creatinine: 1.06  (07/21/2009)   Serum potassium 3.8  (07/21/2009)    Hypertension flowsheet reviewed?: Yes   Progress toward BP goal: At goal  Self-Management Support :   Personal Goals (by the next clinic visit) :     Personal A1C goal: 6  (08/04/2009)     Personal blood pressure goal: 130/80  (08/04/2009)     Personal LDL goal: 100  (08/04/2009)    Diabetes self-management support: Not documented    Diabetes self-management support not done because: Not indicated  (08/04/2009)   Last diabetes self-management training by diabetes educator: completed    Hypertension self-management support: Not documented    Hypertension self-management support not done because: Not indicated  (08/04/2009)  Appended Document: NH visit charge correction    Clinical Lists Changes  Orders: Added new Test order of Provider Misc Charge- [facility] (Misc) - Signed

## 2010-04-27 NOTE — Miscellaneous (Signed)
Summary: A1C update - Rx  Clinical Lists Changes  Medications: Added new medication of NAMENDA 10 MG TABS (MEMANTINE HCL) two times a day - Signed Rx of NAMENDA 10 MG TABS (MEMANTINE HCL) two times a day;  #1 x 0;  Signed;  Entered by: Christian Mate D;  Authorized by: Madelon Lips Pharm D;  Method used: Historical Observations: Added new observation of HGBA1C: 6.0 % (08/28/2009 12:32)    Prescriptions: NAMENDA 10 MG TABS (MEMANTINE HCL) two times a day  #1 x 0   Entered and Authorized by:   Christian Mate D   Signed by:   Madelon Lips Pharm D on 11/25/2009   Method used:   Historical   RxID:   [id number]

## 2010-04-27 NOTE — Miscellaneous (Signed)
Summary: Med Update - Rx  Clinical Lists Changes  Medications: Removed medication of EUCERIN  CREA (SKIN PROTECTANTS, MISC.) Apply two times a day as needed to affected areas Removed medication of NAMENDA 10 MG TABS (MEMANTINE HCL) two times a day Removed medication of SARNA SENSITIVE 1 % LOTN (PRAMOXINE HCL) apply to affected area two times a day Removed medication of LISINOPRIL 20 MG TABS (LISINOPRIL) 1 tab by mouth daily Added new medication of LISINOPRIL 10 MG TABS (LISINOPRIL) once daily - Signed Added new medication of ERGOCALCIFEROL 50000 UNIT CAPS (ERGOCALCIFEROL) once monthly - Signed Rx of LISINOPRIL 10 MG TABS (LISINOPRIL) once daily;  #1 x 0;  Signed;  Entered by: Christian Mate D;  Authorized by: Madelon Lips Pharm D;  Method used: Historical Rx of ERGOCALCIFEROL 50000 UNIT CAPS (ERGOCALCIFEROL) once monthly;  #1 x 0;  Signed;  Entered by: Christian Mate D;  Authorized by: Madelon Lips Pharm D;  Method used: Historical    Prescriptions: ERGOCALCIFEROL 50000 UNIT CAPS (ERGOCALCIFEROL) once monthly  #1 x 0   Entered and Authorized by:   Christian Mate D   Signed by:   Madelon Lips Pharm D on 11/19/2009   Method used:   Historical   RxID:   [id number] LISINOPRIL 10 MG TABS (LISINOPRIL) once daily  #1 x 0   Entered and Authorized by:   Christian Mate D   Signed by:   Madelon Lips Pharm D on 11/19/2009   Method used:   Historical   RxID:   [id number]

## 2010-04-27 NOTE — Assessment & Plan Note (Signed)
Summary: Nursing Home Visit   Vital Signs:  Patient profile:   77 year old female Weight:      127 pounds BMI:     19.96 Temp:     97.3 degrees F Pulse rate:   80 / minute Resp:     18 per minute BP sitting:   110 / 56  Primary Care Provider:  Jamie Brookes MD   History of Present Illness: Parkinsonism: [name] is a non-verbal AAF who has signs of Parkinsonism. She has rigidity and is not very interactive in her environment.   Weight loss: Pt has lost weight recently. She has been on a slow decline from 142.4 in March to 127 now. the Megace was stopped on 6/21 secornadry to hypernatemia and a hospital admission. Pt's diet is pureed and has Prostat, Magic cup three times a day, Ensure QID, and Medpass  three times a day and she still does not appear to be putting on weight. She is getting weekly weights taken. She eats about 25-100% of her meals. She is now suppose to be getting double portions as of (7/14)  DM: Pt has an A1c that is 6.0 (6/6). She is not eating consistantly and does not appear to need the diabetes meds anymore. Pt has a pured diet.   Code Status: Pt continues to be full code, but family has determined that they do not want to put a feeding tube in her (PEG).   Problems Prior to Update: 1)  Weight Loss  (ICD-783.21) 2)  Diabetes Mellitus, Type II, With Renal Complications  (ICD-250.40) 3)  Alzheimer's Disease  (ICD-331.0) 4)  Parkinsonism  (ICD-332.0) 5)  Hypertension, Benign  (ICD-401.1) 6)  Anemia, Hypochromic  (ICD-280.9) 7)  Lesion, Ulnar Nerve  (ICD-354.2) 8)  Glaucoma Nos  (ICD-365.9) 9)  Degenerative Joint Disease, Knee  (ICD-715.96) 10)  Encounter For Long-term Use of Other Medications  (ICD-V58.69) 11)  Family History Breast Cancer 1st Degree Relative <50  (ICD-V16.3) 12)  Family History of Colon Ca 1st Degree Relative <60  (ICD-V16.0) 13)  Person Living in Residential Institution  (ICD-V60.6)  Current Medications (verified): 1)  Lisinopril  20 Mg Tabs (Lisinopril) .Marland Kitchen.. 1 Tab By Mouth Daily 2)  Adult Aspirin Ec Low Strength 81 Mg Tbec (Aspirin) .Marland Kitchen.. 1 Tab By Mouth Daily 3)  Metformin Hcl 1000 Mg  Tabs (Metformin Hcl) .... Take One Tab in A.m. 4)  Miralax  Powd (Polyethylene Glycol 3350) .Marland Kitchen.. 17g in 8 Oz Water Daily 5)  Tylenol 8 Hour 650 Mg  Tbcr (Acetaminophen) .... Take 1 Tabs Twice Daily 6)  Multivitamins  Tabs (Multiple Vitamin) .... One Daily 7)  Timoptic-Xe 0.5 % Solg (Timolol Maleate) .... One Drop Both Eys Daily 8)  Xalatan 0.005 % Soln (Latanoprost) .Marland Kitchen.. 1 Drop Both Eyes Daily 9)  Namenda 10 Mg Tabs (Memantine Hcl) .... Two Times A Day 10)  Sarna Sensitive 1 % Lotn (Pramoxine Hcl) .... Apply To Affected Area Two Times A Day 11)  Eucerin  Crea (Skin Protectants, Misc.) .... Apply Two Times A Day As Needed To Affected Areas 12)  Sinemet 25-250 Mg Tabs (Carbidopa-Levodopa) .... Three Times A Day Prior To Meals  Allergies (verified): 1)  ! 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) POSTURAL TREMOR (ICD-781.0) ACUTE KIDNEY  FAILURE UNSPECIFIED (ICD-584.9) FAMILY HISTORY OF COLON CA 1ST DEGREE RELATIVE <60 (ICD-V16.0) CANDIDIASIS, ORAL (ICD-112.0) INTERTRIGO (ICD-695.89) WEIGHT LOSS, RECENT (ICD-783.21) ANEMIA, HYPOCHROMIC (ICD-280.9) CARPAL TUNNEL SYNDROME (ICD-354.0) ENCOUNTER FOR LONG-TERM USE OF OTHER MEDICATIONS (ICD-V58.69) FAMILY HISTORY BREAST CANCER 1ST DEGREE RELATIVE <50 (ICD-V16.3) SENILE DEMENTIA WITH DEPRESSIVE FEATURES (ICD-290.21) LESION, ULNAR NERVE (ICD-354.2) LEG EDEMA, BILATERAL (ICD-782.3) GLAUCOMA NOS (ICD-365.9) INSOMNIA, PERSISTENT (ICD-307.42) DEGENERATIVE JOINT DISEASE, KNEE (ICD-715.96) DIABETES MELLITUS, TYPE II  (ICD-250.00) HYPERTENSION, BENIGN (ICD-401.1) ALZHEIMER'S DISEASE (ICD-331.0)    Past Surgical History: Last updated: 08/19/2008 appendectomy at age 83 Tubal ligation hysterectomy for heavy bleeding bilateral cataract removals  ~1990 Cardiac cath, aortogram normal 1/05 Permanent pacemaker - McQueen 3/05 oophorectomy for mucinous cyst adenoma 5/05 laser trabeculotomy 5/08  Family History: Last updated: 08/19/2008 Family History Breast cancer 1st degree relative <50 Daughter, Forest Becker died age 13 Family History of Colon CA 1st degree relative <60 Daughter currently being treated.  sister w/ dm father w/ dm mother AD, HTN  Social History: Last updated: 08/22/2008  husband and son, 5 children Education 9+ years Husband alive and still works for a Technical brewer, in his 50's Son, Gery Pray, stays with them Lived with Hulan Amato, daughter in [place] Armandina Stammer, works at [employer], has colon CA [employer], works for [employer]. Grandmother of Water quality scientist at [employer]) [employer] faith Snuff dipper in past Never Smoked Alcohol use-no Regular exercise-no  Review of Systems       pertinent negatives and positives seen in HPI   Physical Exam  General:  Well-developed,well-nourished,in no acute distress; alert,appropriate and cooperative throughout examination, sitting in chair Head:  Normocephalic and atraumatic without obvious abnormalities. No apparent alopecia or balding. Mouth:  pt tightly clenches mouth and will not open for viewing.  Lungs:  Normal respiratory effort, chest expands symmetrically. Lungs are clear to auscultation, no crackles or wheezes. Heart:  Normal rate and regular rhythm. S1 and S2 normal without gallop, murmur, click, rub or other extra sounds. Abdomen:  Bowel sounds positive,abdomen soft and non-tender without masses, organomegaly or hernias noted. Msk:  pt has very ridged arms, hands and legs.  Extremities:  No  clubbing, cyanosis, edema, or deformity noted with normal full range of motion of all joints.   Neurologic:  not oriented nor cooperative with exam Skin:  Intact without suspicious lesions or rashes Psych:  withdrawn. severe dementia   Impression & Recommendations:  Problem # 1:  PARKINSONISM (ICD-332.0) Assessment Deteriorated Pt continues to decline in mental status. She once talked when I first met her. Now she does not talk and can not feed herself. No medications for dementia seemed to help.  Orders: [facility]- Est Level  3 (16109)  Problem # 2:  WEIGHT LOSS (ICD-783.21) Assessment: Deteriorated Pt continues to lose weight. She does not eat well. Cont supplements mentioned in HPI. No feeding tube per family.   Orders: [facility]- Est Level  3 (60454)  Problem # 3:  DIABETES MELLITUS, TYPE II, WITH RENAL COMPLICATIONS (ICD-250.40) Assessment: Improved Pt has DM but has not been eating consistantly, has been losing weight and her A1c is 6.0. I would like to d/c her Metformin.   Her updated medication list for this problem includes:    Lisinopril 20 Mg Tabs (Lisinopril) .Marland Kitchen... 1 tab by mouth daily    Adult Aspirin Ec Low Strength 81 Mg Tbec (Aspirin) .Marland Kitchen... 1 tab by mouth daily    Metformin Hcl 1000 Mg Tabs (Metformin hcl) .Marland Kitchen... Take one tab in a.m.  Orders: [facility]- Est Level  3 (16109)  Complete Medication List: 1)  Lisinopril 20 Mg Tabs (Lisinopril) .Marland Kitchen.. 1 tab by mouth daily 2)  Adult Aspirin Ec Low Strength 81 Mg Tbec (Aspirin) .Marland Kitchen.. 1 tab by mouth daily 3)  Metformin Hcl 1000 Mg Tabs (Metformin hcl) .... Take one tab in a.m. 4)  Miralax Powd (Polyethylene glycol 3350) .Marland Kitchen.. 17g in 8 oz water daily 5)  Tylenol 8 Hour 650 Mg Tbcr (Acetaminophen) .... Take 1 tabs twice daily 6)  Multivitamins Tabs (Multiple vitamin) .... One daily 7)  Timoptic-xe 0.5 % Solg (Timolol maleate) .... One drop both eys daily 8)  Xalatan 0.005 % Soln (Latanoprost) .Marland Kitchen.. 1 drop both eyes daily 9)  Namenda 10 Mg  Tabs (Memantine hcl) .... Two times a day 10)  Sarna Sensitive 1 % Lotn (Pramoxine hcl) .... Apply to affected area two times a day 11)  Eucerin Crea (Skin protectants, misc.) .... Apply two times a day as needed to affected areas 12)  Sinemet 25-250 Mg Tabs (Carbidopa-levodopa) .... Three times a day prior to meals

## 2010-04-27 NOTE — Miscellaneous (Signed)
Summary: NH visit review  Clinical Lists Changes  Problems: Changed problem from DIABETES MELLITUS, TYPE II (ICD-250.00) to DIABETES MELLITUS, TYPE II, WITH RENAL COMPLICATIONS (ICD-250.40) Orders: Added new Test order of Provider Misc Charge- [facility] (Misc) - Signed I visited her, discussed her care, and reviewed the progress note of Raymon Mutton, [place] from 04/02/09.   Appended Document: NH visit review    Clinical Lists Changes  Orders: Added new Test order of Hospital Admit-[facility] (00000) - Signed

## 2010-04-27 NOTE — Assessment & Plan Note (Signed)
Summary: NH visit        New Problems ABNORMALITY OF GAIT (ICD-781.2)   Problems Assessed Assessed ALZHEIMER'S DISEASE as comment only - Zachery Dauer MD Assessed PARKINSONISM as unchanged - Zachery Dauer MD  Allergies ! PENICILLIN ARICEPT (DONEPEZIL HCL) NORVASC              Primary Care Provider:  Jamie Brookes MD   History of Present Illness: There has been little recent change in her activity. Essentially mute with little interaction.    Physical Exam  General:  Sitting in Colbert chair. Appears comfortable. Eyes:  no injection.   Lungs:  Normal respiratory effort, chest expands symmetrically. Lungs are clear to auscultation, no crackles or wheezes. Heart:  Normal rate and regular rhythm. S1 and S2 normal without gallop, murmur, click, rub or other extra sounds. Msk:  pt has very ridged arms, hands and legs. Splints in hands, but not maintained in neutral position Neurologic:  not oriented nor cooperative with exam. Cogwheeling in the left arm which could be partially moved. Generalized contractures.  Psych:  withdrawn and poor eye contact.  No verbal response.    Impression & Recommendations:  Problem # 1:  ALZHEIMER'S DISEASE (ICD-331.0) Gradual decline Orders: Provider Misc Charge- [facility] (Misc)  Problem # 2:  PARKINSONISM (ICD-332.0) Assessment: Unchanged  Orders: Provider Misc Charge- [facility] (Misc)  Complete Medication List: 1)  Adult Aspirin Ec Low Strength 81 Mg Tbec (Aspirin) .Marland Kitchen.. 1 tab by mouth daily 2)  Miralax Powd (Polyethylene glycol 3350) .Marland Kitchen.. 17g in 8 oz water daily 3)  Tylenol 8 Hour 650 Mg Tbcr (Acetaminophen) .... Take 1 tabs twice daily 4)  Multivitamins Tabs (Multiple vitamin) .... One daily 5)  Timoptic-xe 0.5 % Solg (Timolol maleate) .... One drop both eys daily 6)  Xalatan 0.005 % Soln (Latanoprost) .Marland Kitchen.. 1 drop both eyes daily 7)  Sinemet 25-250 Mg Tabs (Carbidopa-levodopa) .... Three times a day prior to meals 8)  Lisinopril 10 Mg Tabs  (Lisinopril) .... Once daily 9)  Ergocalciferol 50000 Unit Caps (Ergocalciferol) .... Once monthly 10)  Namenda 10 Mg Tabs (Memantine hcl) .... Two times a day 11)  Metformin Hcl 1000 Mg Tabs (Metformin hcl) .... Once daily

## 2010-04-27 NOTE — Miscellaneous (Signed)
Summary: NH visit    Vital Signs:  Patient profile:   77 year old female Height:      67 inches Temp:     97.5 degrees F Pulse rate:   62 / minute Resp:     20 per minute BP sitting:   102 / 63  Vitals Entered By: staff 8/7    Primary Care Provider:  Jamie Brookes MD   History of Present Illness: No changes per nursing    -  Date:  10/26/2009    Weight 129.6    Physical Exam  General:  Thinner, lying in bed at awkward angle turned to side with wedge pillow. Grimace on face Neck:  forward with deceased mobiilty Lungs:  Normal respiratory effort, chest expands symmetrically. Lungs are clear to auscultation, no crackles or wheezes. Heart:  Normal rate and regular rhythm. S1 and S2 normal without gallop, murmur, click, rub or other extra sounds. Abdomen:  Won't/cant'  relax muscles, but no indications of tenderness Msk:  pt has very ridged arms, hands and legs. Splints in hands, but not maintained in neutral position Extremities:  No edema Neurologic:  not oriented nor cooperative with exam. Cogwheeling in the left arm which could be partially moved. Generalized contractures.  Skin:  No pressure sores seen.  Psych:  withdrawn and poor eye contact.  No verbal response.     Impression & Recommendations:  Problem # 1:  ALZHEIMER'S DISEASE (ICD-331.0) Continued functional decline Orders: Provider Misc Charge- [facility] (Misc)  Problem # 2:  PARKINSONISM (ICD-332.0) Poor response to medication. Suspect DLBD Orders: Provider Misc Charge- [facility] (Misc)  Problem # 3:  WEIGHT LOSS (ICD-783.21) Continues decreased  intake but weight stable past month.  Orders: Provider Misc Charge- [facility] (Misc)  Problem # 4:  DIABETES MELLITUS, TYPE II, WITH RENAL COMPLICATIONS (ICD-250.40) Agree with decreasing medication as her weight decreased  Her updated medication list for this problem includes:    Lisinopril 20 Mg Tabs (Lisinopril) .Marland Kitchen... 1 tab by mouth daily    Adult Aspirin Ec Low  Strength 81 Mg Tbec (Aspirin) .Marland Kitchen... 1 tab by mouth daily    Metformin Hcl 1000 Mg Tabs (Metformin hcl) .Marland Kitchen... Take one tab in a.m.  Orders: Provider Misc Charge- [facility] (Misc)  Problem # 5:  HYPERTENSION, BENIGN (ICD-401.1) Controlled Her updated medication list for this problem includes:    Lisinopril 20 Mg Tabs (Lisinopril) .Marland Kitchen... 1 tab by mouth daily  Complete Medication List: 1)  Lisinopril 20 Mg Tabs (Lisinopril) .Marland Kitchen.. 1 tab by mouth daily 2)  Adult Aspirin Ec Low Strength 81 Mg Tbec (Aspirin) .Marland Kitchen.. 1 tab by mouth daily 3)  Metformin Hcl 1000 Mg Tabs (Metformin hcl) .... Take one tab in a.m. 4)  Miralax Powd (Polyethylene glycol 3350) .Marland Kitchen.. 17g in 8 oz water daily 5)  Tylenol 8 Hour 650 Mg Tbcr (Acetaminophen) .... Take 1 tabs twice daily 6)  Multivitamins Tabs (Multiple vitamin) .... One daily 7)  Timoptic-xe 0.5 % Solg (Timolol maleate) .... One drop both eys daily 8)  Xalatan 0.005 % Soln (Latanoprost) .Marland Kitchen.. 1 drop both eyes daily 9)  Namenda 10 Mg Tabs (Memantine hcl) .... Two times a day 10)  Sarna Sensitive 1 % Lotn (Pramoxine hcl) .... Apply to affected area two times a day 11)  Eucerin Crea (Skin protectants, misc.) .... Apply two times a day as needed to affected areas 12)  Sinemet 25-250 Mg Tabs (Carbidopa-levodopa) .... Three times a day prior to meals    Prevention &  Chronic Care Immunizations   Influenza vaccine: given  (04/22/2008)   Influenza vaccine due: 04/22/2009    Tetanus booster: 05/27/2003: given   Tetanus booster due: 05/26/2013    Pneumococcal vaccine: given  (12/04/2007)   Pneumococcal vaccine due: None    H. zoster vaccine: Not documented  Colorectal Screening   Hemoccult: Not documented   Hemoccult action/deferral: Not indicated  (10/12/2009)    Colonoscopy: Not documented   Colonoscopy due: Not Indicated  Other Screening   Pap smear: done  (08/18/2006)   Pap smear due: Not Indicated    Mammogram: BI-RADS CATEGORY 2:  Benign finding(s).^MM  DIGITAL DIAG LTD L  (06/12/2008)   Mammogram action/deferral: Deferred  (08/04/2009)   Mammogram due: 06/02/2009    DXA bone density scan: Not documented   DXA bone density action/deferral: Not indicated  (10/12/2009)   Smoking status: never  (07/15/2008)  Diabetes Mellitus   HgbA1C: 5.4  (03/12/2009)   HgbA1C action/deferral: Not indicated  (08/04/2009)   Hemoglobin A1C due: 09/03/2008    Eye exam: normal  (03/29/2007)   Diabetic eye exam action/deferral: Not indicated  (08/04/2009)   Eye exam due: 03/28/2008    Foot exam: yes  (07/15/2008)   Foot exam action/deferral: Not indicated   High risk foot: Not documented   Foot care education: completed  (04/22/2008)   Foot exam due: 04/22/2009    Urine microalbumin/creatinine ratio: Not documented   Urine microalbumin action/deferral: Not indicated    Diabetes flowsheet reviewed?: Yes   Progress toward A1C goal: Unchanged  Lipids   Total Cholesterol: Not documented   Lipid panel action/deferral: Not indicated   LDL: Not documented   LDL Direct: 139  (10/16/2007)   HDL: Not documented   Triglycerides: Not documented  Hypertension   Last Blood Pressure: 102 / 63  (11/04/2009)   Serum creatinine: 1.06  (07/21/2009)   Serum potassium 3.8  (07/21/2009)    Hypertension flowsheet reviewed?: Yes   Progress toward BP goal: At goal  Self-Management Support :   Personal Goals (by the next clinic visit) :     Personal A1C goal: 8  (10/12/2009)     Personal blood pressure goal: 130/80  (08/04/2009)     Personal LDL goal: 100  (08/04/2009)    Diabetes self-management support: Not documented    Diabetes self-management support not done because: Not indicated  (08/04/2009)   Last diabetes self-management training by diabetes educator: completed    Hypertension self-management support: Not documented    Hypertension self-management support not done because: Not indicated  (08/04/2009)

## 2010-04-27 NOTE — Miscellaneous (Signed)
Summary: Med Update - Rx  Clinical Lists Changes  Medications: Changed medication from NAMENDA 10 MG TABS (MEMANTINE HCL) take one pill daily to NAMENDA 10 MG TABS (MEMANTINE HCL) two times a day - Signed Changed medication from METFORMIN HCL 1000 MG  TABS (METFORMIN HCL) take one tab in A.M. and 1 tab in P.M. to METFORMIN HCL 1000 MG  TABS (METFORMIN HCL) take one tab in A.M. - Signed Rx of NAMENDA 10 MG TABS (MEMANTINE HCL) two times a day;  #1 x 0;  Signed;  Entered by: Christian Mate D;  Authorized by: Madelon Lips Pharm D;  Method used: Historical Rx of METFORMIN HCL 1000 MG  TABS (METFORMIN HCL) take one tab in A.M.;  #1 x 0;  Signed;  Entered by: Christian Mate D;  Authorized by: Madelon Lips Pharm D;  Method used: Historical    Prescriptions: METFORMIN HCL 1000 MG  TABS (METFORMIN HCL) take one tab in A.M.  #1 x 0   Entered and Authorized by:   Christian Mate D   Signed by:   Madelon Lips Pharm D on 04/15/2009   Method used:   Historical   RxID:   [id number] NAMENDA 10 MG TABS (MEMANTINE HCL) two times a day  #1 x 0   Entered and Authorized by:   Christian Mate D   Signed by:   Madelon Lips Pharm D on 04/15/2009   Method used:   Historical   RxID:   [id number]

## 2010-04-27 NOTE — Assessment & Plan Note (Signed)
Summary: Nursing home Visit   Vital Signs:  Patient profile:   77 year old female Weight:      130 pounds Temp:     97.0 degrees F Pulse rate:   64 / minute Resp:     20 per minute BP supine:   132 / 68  Primary Care Provider:  Jamie Brookes MD   History of Present Illness: Pt continues to be minimally verbal with severe parkinson rigidity. She sits in a chair often. Had some stasis ulcers but they have cleared per chart notes. Weight has stayed stable.   Current Medications (verified): 1)  Adult Aspirin Ec Low Strength 81 Mg Tbec (Aspirin) .Marland Kitchen.. 1 Tab By Mouth Daily 2)  Miralax  Powd (Polyethylene Glycol 3350) .Marland Kitchen.. 17g in 8 Oz Water Daily 3)  Tylenol 8 Hour 650 Mg  Tbcr (Acetaminophen) .... Take 1 Tabs Twice Daily 4)  Multivitamins  Tabs (Multiple Vitamin) .... One Daily 5)  Timoptic-Xe 0.5 % Solg (Timolol Maleate) .... One Drop Both Eys Daily 6)  Xalatan 0.005 % Soln (Latanoprost) .Marland Kitchen.. 1 Drop Both Eyes Daily 7)  Sinemet 25-250 Mg Tabs (Carbidopa-Levodopa) .... Three Times A Day Prior To Meals 8)  Lisinopril 10 Mg Tabs (Lisinopril) .... Once Daily 9)  Ergocalciferol 50000 Unit Caps (Ergocalciferol) .... Once Monthly 10)  Namenda 10 Mg Tabs (Memantine Hcl) .... Two Times A Day 11)  Metformin Hcl 1000 Mg Tabs (Metformin Hcl) .... Once Daily  Allergies (verified): 1)  ! 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) POSTURAL TREMOR (ICD-781.0) ACUTE KIDNEY FAILURE UNSPECIFIED (ICD-584.9) FAMILY HISTORY OF COLON CA 1ST DEGREE RELATIVE <60 (ICD-V16.0) CANDIDIASIS, ORAL (ICD-112.0) INTERTRIGO (ICD-695.89) WEIGHT LOSS, RECENT (ICD-783.21) ANEMIA, HYPOCHROMIC (ICD-280.9) CARPAL TUNNEL SYNDROME (ICD-354.0) ENCOUNTER FOR LONG-TERM  USE OF OTHER MEDICATIONS (ICD-V58.69) FAMILY HISTORY BREAST CANCER 1ST DEGREE RELATIVE <50 (ICD-V16.3) SENILE DEMENTIA WITH DEPRESSIVE FEATURES (ICD-290.21) LESION, ULNAR NERVE (ICD-354.2) LEG EDEMA, BILATERAL (ICD-782.3) GLAUCOMA NOS (ICD-365.9) INSOMNIA, PERSISTENT (ICD-307.42) DEGENERATIVE JOINT DISEASE, KNEE (ICD-715.96) DIABETES MELLITUS, TYPE II (ICD-250.00) HYPERTENSION, BENIGN (ICD-401.1) ALZHEIMER'S DISEASE (ICD-331.0)    Past Surgical History: Last updated: 08/19/2008 appendectomy at age 82 Tubal ligation hysterectomy for heavy bleeding bilateral cataract removals  ~1990 Cardiac cath, aortogram normal 1/05 Permanent pacemaker - McQueen 3/05 oophorectomy for mucinous cyst adenoma 5/05 laser trabeculotomy 5/08  Family History: Last updated: 08/19/2008 Family History Breast cancer 1st degree relative <50 Daughter, Forest Becker died age 75 Family History of Colon CA 1st degree relative <60 Daughter currently being treated.  sister w/ dm father w/ dm mother AD, HTN  Social History: Last updated: 02/02/2010  husband and son, 5 children Education 9+ years Husband alive and still works for a Technical brewer, in his 49's Son, Gery Pray, stays with them Lived with Hulan Amato, daughter in [place], daughter [phone]) works at [employer], has colon [place] Passenger transport manager, works for [employer]. Grandmother of Water quality scientist at [place]) [phone], [phone]  [employer] Sn[employer] dipper in past Never Smoked Alcohol use-no Regular exercise-no  Social History:  husband and son, 5 children Education 9+ years Husband alive and still works for a Technical brewer, in his 30's Son, Gery Pray, stays with them Lived with Hulan Amato, daughter in [place], daughter [phone]) works at [employer], has colon  [place] [employer], works for [employer]. Grandmother of Water quality scientist at [place]) [phone],  [phone]  [employer] Snuff dipper in past Never Smoked Alcohol use-no Regular exercise-no  Physical Exam  General:  NAD, sitting in chair peacefully Eyes:  No corneal or conjunctival inflammation noted.  Ears:  no external deformities.   Nose:  no external deformity.   Mouth:  Oral mucosa and oropharynx without lesions or exudates.  no teeth Lungs:  Normal respiratory effort, chest expands symmetrically. Lungs are clear to auscultation, no crackles or wheezes. Heart:  Normal rate and regular rhythm. S1 and S2 normal without gallop, murmur, click, rub or other extra sounds. Abdomen:  Bowel sounds positive,abdomen soft and non-tender  Msk:  continues to be very rigid in extremities and hands, contractures at elbow and hands.  Neurologic:  pt does not follow commands Psych:  almost completely nonverbal, mild mannered.    Impression & Recommendations:  Problem # 1:  PARKINSONISM (ICD-332.0) Assessment Unchanged Pt continues on Sinemet. Cont to have rigidity. Plan to cont current dose of Sinemet.   Problem # 2:  ALZHEIMER'S DISEASE (ICD-331.0) Assessment: Unchanged Pt did say that she was doing fine which is new since she has not talked to me the last 2 times i was there. Cont Namenda at family's request although I'm not sure it is doing much for her.   Problem # 3:  WEIGHT LOSS (ICD-783.21) Assessment: Unchanged Pt continues to be about 130 lbs.  She is eating Ensure pudding, Magic Cup, Medpass and double meal portions to maintain her weight.  I suspect she is not getting all this in. She had food on her shirt when I saw her.   Problem # 4:  GLAUCOMA NOS (ICD-365.9) Assessment: Unchanged Cont Timoptic and Xalatan drops for glaucoma.   Problem # 5:  HYPERTENSION, BENIGN (ICD-401.1) Assessment: Unchanged PT's BP is well controlled. Cont current meds.   Her updated medication list for this problem includes:    Lisinopril 10 Mg Tabs (Lisinopril) ..... Once daily  Problem  # 6:  PERSON LIVING IN RESIDENTIAL INSTITUTION (ICD-V60.6) Assessment: Unchanged Pt continues to be a full code at this time.   Problem # 7:  End of Life Issues: Tried to call daughter and granddaughter to discuss end of life issues. Could not get through to either of them on their current phone numbers.   Numbers tried: Seward Meth   [phone]  and [phone],     Jasmine Awe [phone]   Wanted to discuss the issue of a feeding tube that is reported as undecided.  Complete Medication List: 1)  Adult Aspirin Ec Low Strength 81 Mg Tbec (Aspirin) .Marland Kitchen.. 1 tab by mouth daily 2)  Miralax Powd (Polyethylene glycol 3350) .Marland Kitchen.. 17g in 8 oz water daily 3)  Tylenol 8 Hour 650 Mg Tbcr (Acetaminophen) .... Take 1 tabs twice daily 4)  Multivitamins Tabs (Multiple vitamin) .... One daily 5)  Timoptic-xe 0.5 % Solg (Timolol maleate) .... One drop both eys daily 6)  Xalatan 0.005 % Soln (Latanoprost) .Marland Kitchen.. 1 drop both eyes daily 7)  Sinemet 25-250 Mg Tabs (Carbidopa-levodopa) .... Three times a day prior to meals 8)  Lisinopril 10 Mg Tabs (Lisinopril) .... Once daily 9)  Ergocalciferol 50000 Unit Caps (Ergocalciferol) .... Once monthly 10)  Namenda 10 Mg Tabs (Memantine hcl) .... Two times a day P     -  Date:  01/07/2010    HGB 9.6    PLTS 217  Date:  12/25/2009  HgBA1c 6.1

## 2010-04-27 NOTE — Miscellaneous (Signed)
Summary: Med Update Rx  Clinical Lists Changes  Medications: Added new medication of SARNA SENSITIVE 1 % LOTN (PRAMOXINE HCL) apply to affected area two times a day - Signed Added new medication of EUCERIN  CREA (SKIN PROTECTANTS, MISC.) Apply two times a day as needed to affected areas - Signed Rx of SARNA SENSITIVE 1 % LOTN (PRAMOXINE HCL) apply to affected area two times a day;  #1 x 0;  Signed;  Entered by: Christian Mate D;  Authorized by: Madelon Lips Pharm D;  Method used: Historical Rx of EUCERIN  CREA (SKIN PROTECTANTS, MISC.) Apply two times a day as needed to affected areas;  #1 x 0;  Signed;  Entered by: Christian Mate D;  Authorized by: Madelon Lips Pharm D;  Method used: Historical    Prescriptions: EUCERIN  CREA (SKIN PROTECTANTS, MISC.) Apply two times a day as needed to affected areas  #1 x 0   Entered and Authorized by:   Christian Mate D   Signed by:   Madelon Lips Pharm D on 05/14/2009   Method used:   Historical   RxID:   [id number] SARNA SENSITIVE 1 % LOTN (PRAMOXINE HCL) apply to affected area two times a day  #1 x 0   Entered and Authorized by:   Christian Mate D   Signed by:   Madelon Lips Pharm D on 05/14/2009   Method used:   Historical   RxID:   [id number]

## 2010-04-27 NOTE — Miscellaneous (Signed)
Summary: NH pt visit   Vital Signs:  Patient profile:   77 year old female Weight:      145.4 pounds Temp:     96.8 degrees F Pulse rate:   67 / minute Resp:     17 per minute BP sitting:   174 / 78  Primary Care Provider:  Jamie Brookes MD  CC:  Dementia, Rigidity, weight loss, and DM.  History of Present Illness: Demetia: Pt continues to decline in her cognitive function. The first time I saw her she would talk to me and answer simple questions. During this visit the patient did not talk at all. She would not answer questions.   Parkinsonion Rigidity: Pt continues to decline with what is parkinson-like rigidity. She now has hands that are drawn in and difficult to straighten.   Weight loss: Pt has lost 17 lbs in hte last 9 months.   DM: Well controlled. Last A1c was 5.4 Which is likely due to the patient losing weight.     Current Medications (verified): 1)  Lisinopril 20 Mg Tabs (Lisinopril) .Marland Kitchen.. 1 Tab By Mouth Daily 2)  Adult Aspirin Ec Low Strength 81 Mg Tbec (Aspirin) .Marland Kitchen.. 1 Tab By Mouth Daily 3)  Cartia Xt 240 Mg  Cp24 (Diltiazem Hcl Coated Beads) .... Take One Tablet Daily 4)  Metformin Hcl 1000 Mg  Tabs (Metformin Hcl) .... Take One Tab in A.m. 5)  Miralax  Powd (Polyethylene Glycol 3350) .Marland Kitchen.. 17g in 8 Oz Water Daily 6)  Tylenol 8 Hour 650 Mg  Tbcr (Acetaminophen) .... Take 1 Tabs Twice Daily 7)  Sinemet 25-250 Mg Tabs (Carbidopa-Levodopa) .... Take One in A.m. 8)  Sinemet 25-100 Mg Tabs (Carbidopa-Levodopa) .... Take One Before Lunch and Dinner 9)  Megace Oral 40 Mg/ml Susp (Megestrol Acetate) .... 20ml (800mg ) Daily 10)  Multivitamins  Tabs (Multiple Vitamin) .... One Daily 11)  Resource Beneprotein  Pack (Protein) .... 2 Scoops Two Times A Day 12)  Timoptic-Xe 0.5 % Solg (Timolol Maleate) .... One Drop Both Eys Daily 13)  Xalatan 0.005 % Soln (Latanoprost) .Marland Kitchen.. 1 Drop Both Eyes Daily 14)  Namenda 10 Mg Tabs (Memantine Hcl) .... Two Times A  Day  Allergies: 1)  ! Penicillin 2)  Aricept (Donepezil Hcl) 3)  Norvasc  Physical Exam  General:  sitting in wheel chair, NAD Head:  Normocephalic and atraumatic without obvious abnormalities. No apparent alopecia or balding. Eyes:  Left eye visualized, pt would not let me look at her Rt eye with the light Nose:  External nasal examination shows no deformity or inflammation. Nasal mucosa are pink and moist without lesions or exudates. Mouth:  Oral mucosa and oropharynx without lesions or exudates.  Teeth in good repair. Lungs:  Normal respiratory effort, chest expands symmetrically. Lungs are clear to auscultation, no crackles or wheezes. Heart:  Normal rate and regular rhythm. S1 and S2 normal without gallop, murmur, click, rub or other extra sounds. Abdomen:  Bowel sounds positive,abdomen soft and non-tender without masses, organomegaly or hernias noted. Msk:  rigidity in bilateral upper and lower extremeities.  Pulses:  R and L carotid,radial,femoral,dorsalis pedis and posterior tibial pulses are full and equal bilaterally Extremities:  bilateral hands are edematous  Psych:  flat affect, non verbal, subdued.     Impression & Recommendations:  Problem # 1:  SENILE DEMENTIA WITH DEPRESSIVE FEATURES (ICD-290.21) Assessment Deteriorated PT continues to decline with her dementia. She is no longer answering my questions. She appears to not understand what  I am asking. This is a decline from when i first met her 8 months ago.   Problem # 2:  PARKINSONISM (ICD-332.0) Assessment: Deteriorated Pt is more rigid every time I see her. This time her hands are contracted and while they are still able to be straightened I don't think they will be for long unless she wears the hand splints that were ordered. She has not been wearing them due to hand swelling (unknown cause) but needs to restart as soon as the swelling decreases.   Problem # 3:  WEIGHT LOSS (ICD-783.21) Assessment:  Deteriorated Pt continues to have weight loss. She has lost 19 lbs in the last 9 months according to NH records.  Megace was stopped 04-25-09 and mechanical soft diet ordered on 05-01-09.  Hopefully the patient will be able to increase her intake with the softer diet.   Problem # 4:  DIABETES MELLITUS, TYPE II (ICD-250.00) Assessment: Improved Pt's most current A1c is 5.4 (02/27/09) This is likely improved b/c of her weight loss and poor by mouth intake.   Her updated medication list for this problem includes:    Lisinopril 20 Mg Tabs (Lisinopril) .Marland Kitchen... 1 tab by mouth daily    Adult Aspirin Ec Low Strength 81 Mg Tbec (Aspirin) .Marland Kitchen... 1 tab by mouth daily    Metformin Hcl 1000 Mg Tabs (Metformin hcl) .Marland Kitchen... Take one tab in a.m.  Complete Medication List: 1)  Lisinopril 20 Mg Tabs (Lisinopril) .Marland Kitchen.. 1 tab by mouth daily 2)  Adult Aspirin Ec Low Strength 81 Mg Tbec (Aspirin) .Marland Kitchen.. 1 tab by mouth daily 3)  Cartia Xt 240 Mg Cp24 (Diltiazem hcl coated beads) .... Take one tablet daily 4)  Metformin Hcl 1000 Mg Tabs (Metformin hcl) .... Take one tab in a.m. 5)  Miralax Powd (Polyethylene glycol 3350) .Marland Kitchen.. 17g in 8 oz water daily 6)  Tylenol 8 Hour 650 Mg Tbcr (Acetaminophen) .... Take 1 tabs twice daily 7)  Sinemet 25-250 Mg Tabs (Carbidopa-levodopa) .... Take one in a.m. 8)  Sinemet 25-100 Mg Tabs (Carbidopa-levodopa) .... Take one before lunch and dinner 9)  Megace Oral 40 Mg/ml Susp (Megestrol acetate) .... 20ml (800mg ) daily 10)  Multivitamins Tabs (Multiple vitamin) .... One daily 11)  Resource Beneprotein Pack (Protein) .... 2 scoops two times a day 12)  Timoptic-xe 0.5 % Solg (Timolol maleate) .... One drop both eys daily 13)  Xalatan 0.005 % Soln (Latanoprost) .Marland Kitchen.. 1 drop both eyes daily 14)  Namenda 10 Mg Tabs (Memantine hcl) .... Two times a day

## 2010-04-27 NOTE — Assessment & Plan Note (Signed)
Summary: NH visit    Vital Signs:  Patient profile:   77 year old female Height:      67 inches Weight:      145.4 pounds Temp:     98.6 degrees F Pulse rate:   67 / minute Resp:     17 per minute BP sitting:   174 / 78  Vitals Entered By: staff 04/18/09     Primary Care Provider:  Jamie Brookes MD   History of Present Illness: Grand daughter, Seward Meth, concerned that she is more contracted. She had problems with her aunt not paying the bills for the facility, but is now doing so.    Physical Exam  General:  Pt sitting in wheel chair, disheveled, and has spit up lunch in her lap Lungs:  Normal respiratory effort, chest expands symmetrically. Lungs are clear to auscultation, no crackles or wheezes. Heart:  Normal rate and regular rhythm. S1 and S2 normal without gallop, murmur, click, rub or other extra sounds. Extremities:  Contracted hands, evidences pain with extending left 4th finger, but no focal tenderness.  Neurologic:  strength seems to be equal bilaterally. Masked facies, No tremor, slightly cog wheeled rigidity. Semi-cooperative with exam, but can't follow instructions. No verbal response Psych:  flat affect. poor eye contact.     Impression & Recommendations:  Problem # 1:  LOSS OF WEIGHT (ICD-783.21)  related to immobility and poor appetite. Likely some dysphagia will develop. Wlould continue Megace  Orders: Provider Misc Charge- [facility] (Misc) Provider Misc Charge- [facility] (Misc)  Problem # 2:  PARKINSONISM (ICD-332.0) Assessment: Unchanged  Developing contractures  Orders: Provider Misc Charge- [facility] (Misc) Provider Misc Charge- [facility] (Misc)  Problem # 3:  HYPERTENSION, BENIGN (ICD-401.1) Assessment: Unchanged  Her updated medication list for this problem includes:    Lisinopril 20 Mg Tabs (Lisinopril) .Marland Kitchen... 1 tab by mouth daily    Cartia Xt 240 Mg Cp24 (Diltiazem hcl coated beads) .Marland Kitchen... Take one tablet daily  Orders: Provider Misc Charge- [facility]  (Misc) Provider Misc Charge- [facility] (Misc)  Problem # 4:  DIABETES MELLITUS, TYPE II (ICD-250.00) Assessment: Unchanged  May need less medicine. Consider decreasing Metformin with weight loss.  Her updated medication list for this problem includes:    Lisinopril 20 Mg Tabs (Lisinopril) .Marland Kitchen... 1 tab by mouth daily    Adult Aspirin Ec Low Strength 81 Mg Tbec (Aspirin) .Marland Kitchen... 1 tab by mouth daily    Metformin Hcl 1000 Mg Tabs (Metformin hcl) .Marland Kitchen... Take one tab in a.m.  Orders: Provider Misc Charge- [facility] (Misc) Provider Misc Charge- [facility] (Misc)  Problem # 5:  ALZHEIMER'S DISEASE (ICD-331.0)  Complete Medication List: 1)  Lisinopril 20 Mg Tabs (Lisinopril) .Marland Kitchen.. 1 tab by mouth daily 2)  Adult Aspirin Ec Low Strength 81 Mg Tbec (Aspirin) .Marland Kitchen.. 1 tab by mouth daily 3)  Cartia Xt 240 Mg Cp24 (Diltiazem hcl coated beads) .... Take one tablet daily 4)  Metformin Hcl 1000 Mg Tabs (Metformin hcl) .... Take one tab in a.m. 5)  Miralax Powd (Polyethylene glycol 3350) .Marland Kitchen.. 17g in 8 oz water daily 6)  Tylenol 8 Hour 650 Mg Tbcr (Acetaminophen) .... Take 1 tabs twice daily 7)  Sinemet 25-250 Mg Tabs (Carbidopa-levodopa) .... Take one in a.m. 8)  Sinemet 25-100 Mg Tabs (Carbidopa-levodopa) .... Take one before lunch and dinner 9)  Megace Oral 40 Mg/ml Susp (Megestrol acetate) .... 20ml (800mg ) daily 10)  Multivitamins Tabs (Multiple vitamin) .... One daily 11)  Resource Beneprotein Pack (Protein) .Marland KitchenMarland KitchenMarland Kitchen  2 scoops two times a day 12)  Timoptic-xe 0.5 % Solg (Timolol maleate) .... One drop both eys daily 13)  Xalatan 0.005 % Soln (Latanoprost) .Marland Kitchen.. 1 drop both eyes daily 14)  Namenda 10 Mg Tabs (Memantine hcl) .... Two times a day

## 2010-04-27 NOTE — Progress Notes (Signed)
    Caller: [facility] Summary of Call: [facility] staff called emergency line to inform MD pt has a small abrasion on lateral aspect of right arm.  1x2 cm, not bleeding, no pus, no exudation, just noticed today.  Pt did not fall, just had been picking/scratching at it.  No fevers/chills.  They had dressed it with teraderm.  I asked them to open it, clean it, place neosporin, then cover with gauze and teraderm on top.  Dressing needs to be changed at least once daily.   Initial call taken by: Debby Petties MD,  October 04, 2008 11:11 PM Call placed by: Debby Petties MD,  October 04, 2008 11:08 PM      Appended Document:     Clinical Lists Changes  Problems: Added new problem of PERSON LIVING IN RESIDENTIAL INSTITUTION (ICD-V60.6)

## 2010-04-27 NOTE — Assessment & Plan Note (Signed)
Summary: prevention update    Prevention & Chronic Care Immunizations   Influenza vaccine: given  (04/22/2008)   Influenza vaccine due: 04/22/2009    Tetanus booster: 05/27/2003: given   Tetanus booster due: 05/26/2013    Pneumococcal vaccine: given  (12/04/2007)   Pneumococcal vaccine due: None    H. zoster vaccine: Not documented  Colorectal Screening   Hemoccult: Not documented   Hemoccult action/deferral: Not indicated  (10/12/2009)    Colonoscopy: Not documented   Colonoscopy due: Not Indicated  Other Screening   Pap smear: done  (08/18/2006)   Pap smear due: Not Indicated    Mammogram: BI-RADS CATEGORY 2:  Benign finding(s).^MM DIGITAL DIAG LTD L  (06/12/2008)   Mammogram action/deferral: Deferred  (08/04/2009)   Mammogram due: 06/02/2009    DXA bone density scan: Not documented   DXA bone density action/deferral: Not indicated  (10/12/2009)   Smoking status: never  (07/15/2008)  Diabetes Mellitus   HgbA1C: 5.4  (03/12/2009)   HgbA1C action/deferral: Not indicated  (08/04/2009)   Hemoglobin A1C due: 09/03/2008    Eye exam: normal  (03/29/2007)   Diabetic eye exam action/deferral: Not indicated  (08/04/2009)   Eye exam due: 03/28/2008    Foot exam: yes  (07/15/2008)   Foot exam action/deferral: Not indicated   High risk foot: Not documented   Foot care education: completed  (04/22/2008)   Foot exam due: 04/22/2009    Urine microalbumin/creatinine ratio: Not documented   Urine microalbumin action/deferral: Not indicated    Diabetes flowsheet reviewed?: Yes   Progress toward A1C goal: Unchanged  Lipids   Total Cholesterol: Not documented   Lipid panel action/deferral: Not indicated   LDL: Not documented   LDL Direct: 139  (10/16/2007)   HDL: Not documented   Triglycerides: Not documented  Hypertension   Last Blood Pressure: 132 / 76  (08/04/2009)   Serum creatinine: 1.06  (07/21/2009)   Serum potassium 3.8  (07/21/2009)    Hypertension  flowsheet reviewed?: Yes   Progress toward BP goal: At goal  Self-Management Support :   Personal Goals (by the next clinic visit) :     Personal A1C goal: 8  (10/12/2009)     Personal blood pressure goal: 130/80  (08/04/2009)     Personal LDL goal: 100  (08/04/2009)    Diabetes self-management support: Not documented    Diabetes self-management support not done because: Not indicated  (08/04/2009)   Last diabetes self-management training by diabetes educator: completed    Hypertension self-management support: Not documented    Hypertension self-management support not done because: Not indicated  (08/04/2009)

## 2010-04-27 NOTE — Progress Notes (Signed)
  Phone Note Other Incoming   Caller: [facility] for Reason: CBG 219 Summary of Call: pt with DM on Metformin daily. Order to call for CBG >200. CBG this pm 4:30pm 219. Pt has not eaten dinner yet. Gave order to recheck after meal, will not give SSI based on 1 value over 200.  Initial call taken by: Milinda Antis MD,  October 04, 2009 4:49 PM

## 2010-04-27 NOTE — Miscellaneous (Signed)
Summary: NH visit   Vital Signs:  Patient profile:   77 year old female Height:      67 inches  Primary Care Provider:  Jamie Brookes MD   History of Present Illness: No new problems per staff. [name] can't respond verbally  Current Medications (verified): 1)  Lisinopril 20 Mg Tabs (Lisinopril) .Marland Kitchen.. 1 Tab By Mouth Daily 2)  Adult Aspirin Ec Low Strength 81 Mg Tbec (Aspirin) .Marland Kitchen.. 1 Tab By Mouth Daily 3)  Cartia Xt 240 Mg  Cp24 (Diltiazem Hcl Coated Beads) .... Take One Tablet Daily 4)  Metformin Hcl 1000 Mg  Tabs (Metformin Hcl) .... Take One Tab in A.m. 5)  Miralax  Powd (Polyethylene Glycol 3350) .Marland Kitchen.. 17g in 8 Oz Water Daily 6)  Tylenol 8 Hour 650 Mg  Tbcr (Acetaminophen) .... Take 1 Tabs Twice Daily 7)  Sinemet 25-250 Mg Tabs (Carbidopa-Levodopa) .... Take One in A.m. 8)  Sinemet 25-100 Mg Tabs (Carbidopa-Levodopa) .... Take One Before Lunch and Dinner 9)  Megace Oral 40 Mg/ml Susp (Megestrol Acetate) .... 20ml (800mg ) Daily 10)  Multivitamins  Tabs (Multiple Vitamin) .... One Daily 11)  Resource Beneprotein  Pack (Protein) .... 2 Scoops Two Times A Day 12)  Timoptic-Xe 0.5 % Solg (Timolol Maleate) .... One Drop Both Eys Daily 13)  Xalatan 0.005 % Soln (Latanoprost) .Marland Kitchen.. 1 Drop Both Eyes Daily 14)  Namenda 10 Mg Tabs (Memantine Hcl) .... Two Times A Day 15)  Sarna Sensitive 1 % Lotn (Pramoxine Hcl) .... Apply To Affected Area Two Times A Day 16)  Eucerin  Crea (Skin Protectants, Misc.) .... Apply Two Times A Day As Needed To Affected Areas  Allergies (verified): 1)  ! 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) POSTURAL TREMOR (ICD-781.0) ACUTE KIDNEY FAILURE UNSPECIFIED (ICD-584.9) FAMILY HISTORY OF COLON CA 1ST DEGREE RELATIVE <60 (ICD-V16.0) CANDIDIASIS,  ORAL (ICD-112.0) INTERTRIGO (ICD-695.89) WEIGHT LOSS, RECENT (ICD-783.21) ANEMIA, HYPOCHROMIC (ICD-280.9) CARPAL TUNNEL SYNDROME (ICD-354.0) ENCOUNTER FOR LONG-TERM USE OF OTHER MEDICATIONS (ICD-V58.69) FAMILY HISTORY BREAST CANCER 1ST DEGREE RELATIVE <50 (ICD-V16.3) SENILE DEMENTIA WITH DEPRESSIVE FEATURES (ICD-290.21) LESION, ULNAR NERVE (ICD-354.2) LEG EDEMA, BILATERAL (ICD-782.3) GLAUCOMA NOS (ICD-365.9) INSOMNIA, PERSISTENT (ICD-307.42) DEGENERATIVE JOINT DISEASE, KNEE (ICD-715.96) DIABETES MELLITUS, TYPE II (ICD-250.00) HYPERTENSION, BENIGN (ICD-401.1) ALZHEIMER'S DISEASE (ICD-331.0)    Physical Exam  General:  Lying in bed with mouth pursed and held closed Mouth:  Won't open for exam Lungs:  Normal respiratory effort, chest expands symmetrically. Lungs are clear to auscultation, no crackles or wheezes. Heart:  Normal rate and regular rhythm. occ premature, S1 and S2 normal Grade  2/6 systolic ejection murmur.   Abdomen:  Bowel sounds positive,abdomen soft and non-tender without masses, organomegaly or hernias noted. Old lower midline surgical scar.  Extremities:  Generalized contractures, right knee lacks 60 degrees of extension, left lacks 10 degrees. Won't extend wrists past neutral Neurologic:  + glabellar reflex Mouth kept pursed   Impression & Recommendations:  Problem # 1:  WEIGHT LOSS (ICD-783.21) Continues despite interventions  Problem # 2:  DIABETES MELLITUS, TYPE II, WITH RENAL COMPLICATIONS (ICD-250.40) Likely can decrease Metformin without loss of control Her updated medication list for this problem includes:    Lisinopril 20 Mg Tabs (Lisinopril) .Marland Kitchen... 1 tab by mouth daily    Adult Aspirin Ec Low Strength 81 Mg Tbec (Aspirin) .Marland KitchenMarland KitchenMarland KitchenMarland Kitchen 1  tab by mouth daily    Metformin Hcl 1000 Mg Tabs (Metformin hcl) .Marland Kitchen... Take one tab in a.m.  Orders: Provider Misc Charge- [facility] (Misc)  Problem # 3:  ALZHEIMER'S DISEASE (ICD-331.0) Progressing to late  stage Orders: Provider Misc Charge- [facility] (Misc)  Problem # 4:  HYPERTENSION, BENIGN (ICD-401.1) Assessment: Improved  Her updated medication list for this problem includes:    Lisinopril 20 Mg Tabs (Lisinopril) .Marland Kitchen... 1 tab by mouth daily    Cartia Xt 240 Mg Cp24 (Diltiazem hcl coated beads) .Marland Kitchen... Take one tablet daily  Problem # 5:  PARKINSONISM (ICD-332.0) Responding poorly to medication.  Complete Medication List: 1)  Lisinopril 20 Mg Tabs (Lisinopril) .Marland Kitchen.. 1 tab by mouth daily 2)  Adult Aspirin Ec Low Strength 81 Mg Tbec (Aspirin) .Marland Kitchen.. 1 tab by mouth daily 3)  Cartia Xt 240 Mg Cp24 (Diltiazem hcl coated beads) .... Take one tablet daily 4)  Metformin Hcl 1000 Mg Tabs (Metformin hcl) .... Take one tab in a.m. 5)  Miralax Powd (Polyethylene glycol 3350) .Marland Kitchen.. 17g in 8 oz water daily 6)  Tylenol 8 Hour 650 Mg Tbcr (Acetaminophen) .... Take 1 tabs twice daily 7)  Sinemet 25-250 Mg Tabs (Carbidopa-levodopa) .... Take one in a.m. 8)  Sinemet 25-100 Mg Tabs (Carbidopa-levodopa) .... Take one before lunch and dinner 9)  Megace Oral 40 Mg/ml Susp (Megestrol acetate) .... 20ml (800mg ) daily 10)  Multivitamins Tabs (Multiple vitamin) .... One daily 11)  Resource Beneprotein Pack (Protein) .... 2 scoops two times a day 12)  Timoptic-xe 0.5 % Solg (Timolol maleate) .... One drop both eys daily 13)  Xalatan 0.005 % Soln (Latanoprost) .Marland Kitchen.. 1 drop both eyes daily 14)  Namenda 10 Mg Tabs (Memantine hcl) .... Two times a day 15)  Sarna Sensitive 1 % Lotn (Pramoxine hcl) .... Apply to affected area two times a day 16)  Eucerin Crea (Skin protectants, misc.) .... Apply two times a day as needed to affected areas   Past Medical History:    H pylori positive treated 12/04    5/08 psych consult Dr Jeanie Sewer, transient delirium, mood disorder with insomnia    5/10 CT scan severe atrophy    TROCHANTERIC BURSITIS, LEFT (ICD-726.5)    POSTURAL TREMOR (ICD-781.0)    ACUTE KIDNEY FAILURE UNSPECIFIED  (ICD-584.9)    FAMILY HISTORY OF COLON CA 1ST DEGREE RELATIVE <60 (ICD-V16.0)    CANDIDIASIS, ORAL (ICD-112.0)    INTERTRIGO (ICD-695.89)    WEIGHT LOSS, RECENT (ICD-783.21)    ANEMIA, HYPOCHROMIC (ICD-280.9)    CARPAL TUNNEL SYNDROME (ICD-354.0)    ENCOUNTER FOR LONG-TERM USE OF OTHER MEDICATIONS (ICD-V58.69)    FAMILY HISTORY BREAST CANCER 1ST DEGREE RELATIVE <50 (ICD-V16.3)    SENILE DEMENTIA WITH DEPRESSIVE FEATURES (ICD-290.21)    LESION, ULNAR NERVE (ICD-354.2)    LEG EDEMA, BILATERAL (ICD-782.3)    GLAUCOMA NOS (ICD-365.9)    INSOMNIA, PERSISTENT (ICD-307.42)    DEGENERATIVE JOINT DISEASE, KNEE (ICD-715.96)    DIABETES MELLITUS, TYPE II (ICD-250.00)    HYPERTENSION, BENIGN (ICD-401.1)    ALZHEIMER'S DISEASE (ICD-331.0)           Prevention & Chronic Care Immunizations   Influenza vaccine: given  (04/22/2008)   Influenza vaccine due: 04/22/2009    Tetanus booster: 05/27/2003: given   Tetanus booster due: 05/26/2013    Pneumococcal vaccine: given  (12/04/2007)   Pneumococcal vaccine due: None    H. zoster vaccine: Not documented  Colorectal Screening   Hemoccult: Not documented    Colonoscopy:  Not documented   Colonoscopy due: Not Indicated  Other Screening   Pap smear: done  (08/18/2006)   Pap smear due: Not Indicated    Mammogram: BI-RADS CATEGORY 2:  Benign finding(s).^MM DIGITAL DIAG LTD L  (06/12/2008)   Mammogram due: 06/02/2009    DXA bone density scan: Not documented   Smoking status: never  (07/15/2008)  Diabetes Mellitus   HgbA1C: 5.4  (03/12/2009)   Hemoglobin A1C due: 09/03/2008    Eye exam: normal  (03/29/2007)   Eye exam due: 03/28/2008    Foot exam: yes  (07/15/2008)   High risk foot: Not documented   Foot care education: completed  (04/22/2008)   Foot exam due: 04/22/2009    Urine microalbumin/creatinine ratio: Not documented  Lipids   Total Cholesterol: Not documented   LDL: Not documented   LDL Direct: 139  (10/16/2007)    HDL: Not documented   Triglycerides: Not documented  Hypertension   Last Blood Pressure: 174 / 78  (05/04/2009)   Serum creatinine: 1.37  (06/12/2008)   Serum potassium 3.8  (06/12/2008)  Self-Management Support :    Diabetes self-management support: Not documented   Last diabetes self-management training by diabetes educator: completed    Hypertension self-management support: Not documented

## 2010-04-27 NOTE — Miscellaneous (Signed)
Summary: Medication Update  Clinical Lists Changes  Medications: Removed medication of METFORMIN HCL 1000 MG  TABS (METFORMIN HCL) take one tab in A.M.

## 2010-04-27 NOTE — Progress Notes (Signed)
Summary: Attempts to contact family  ---- Converted from flag ---- ---- 02/09/2010 1:31 PM, Jamie Brookes MD wrote: Jamal Maes to call both daughter Jasmine Awe) and granddaughter Seward Meth). I got the phone numbers from the front of the chart. I'm not sure if they are correct because neither one were answered and one of them went to another person's voicemail.   ---- 11/05/2009 4:59 AM, Zachery Dauer MD wrote: Please call her daughter and document their directives. We've been told by her [employer] PA (who recently resigned) that the family didn't want a feeding tube. Please encourage and document comfort care plan. Her grand daughter, Seward Meth will stop working at [employer] in September. ------------------------------

## 2010-04-27 NOTE — Miscellaneous (Signed)
Summary: Nursing Home Visit   Vital Signs:  Patient profile:   77 year old female Weight:      131 pounds Temp:     97.7 degrees F Pulse rate:   62 / minute Resp:     18 per minute BP sitting:   132 / 76  Primary Care Provider:  Jamie Brookes MD   History of Present Illness: 77 y/o advanced dementia patient who continues to decline. At this visit the patient remains non-verbal and has been experiencing some weight loss.   Progressive Alzheimer's dz with Parkenson's Features: Pt continues to decline. She is non-verbal and this is a change from 1 year ago when I began seeing her. She is very ridged and has a masked face.   Weight loss: According to notes that patient has lost 9% in the last 90 days as of 07/30/09. She is not eating well but continues to get Magic Cup three times a day as tolerated. She is getting weekly weights for the next 3 weeks. She is on a purred diet. Because she is not eating well I will consider stopping her Metformin. She may not need it anymore with her weight loss and decreased by mouth intake.   DM 2: Pt has DM 2 but her CBG's have been normal. At last calculation her CBG's have ranged from the highest in the 200's to the lowest of 63. I would not want her going any lower than this.   OA/Carper Tunnel syndrome: Pt continues to have osteoarthritis. She is very stiff in her joints from Parkenson's features. She is wheelchair bound.    Current Medications (verified): 1)  Lisinopril 20 Mg Tabs (Lisinopril) .Marland Kitchen.. 1 Tab By Mouth Daily 2)  Adult Aspirin Ec Low Strength 81 Mg Tbec (Aspirin) .Marland Kitchen.. 1 Tab By Mouth Daily 3)  Metformin Hcl 1000 Mg  Tabs (Metformin Hcl) .... Take One Tab in A.m. 4)  Miralax  Powd (Polyethylene Glycol 3350) .Marland Kitchen.. 17g in 8 Oz Water Daily 5)  Tylenol 8 Hour 650 Mg  Tbcr (Acetaminophen) .... Take 1 Tabs Twice Daily 6)  Sinemet 25-100 Mg Tabs (Carbidopa-Levodopa) .... Take One Before Lunch and Dinner 7)  Megace Oral 40 Mg/ml Susp (Megestrol  Acetate) .... 20ml (800mg ) Daily 8)  Multivitamins  Tabs (Multiple Vitamin) .... One Daily 9)  Timoptic-Xe 0.5 % Solg (Timolol Maleate) .... One Drop Both Eys Daily 10)  Xalatan 0.005 % Soln (Latanoprost) .Marland Kitchen.. 1 Drop Both Eyes Daily 11)  Namenda 10 Mg Tabs (Memantine Hcl) .... Two Times A Day 12)  Sarna Sensitive 1 % Lotn (Pramoxine Hcl) .... Apply To Affected Area Two Times A Day 13)  Eucerin  Crea (Skin Protectants, Misc.) .... Apply Two Times A Day As Needed To Affected Areas  Allergies (verified): 1)  ! Penicillin 2)  Aricept (Donepezil Hcl) 3)  Norvasc  Review of Systems        vitals reviewed and pertinent negatives and positives seen in HPI   Physical Exam  General:  Well-developed,well-nourished,in no acute distress; alert,appropriate and cooperative throughout examination Head:  Normocephalic and atraumatic without obvious abnormalities. No apparent alopecia or balding. Lungs:  Normal respiratory effort, chest expands symmetrically. Lungs are clear to auscultation, no crackles or wheezes. Heart:  Normal rate and regular rhythm. S1 and S2 normal without gallop, murmur, click, rub or other extra sounds. Abdomen:  Bowel sounds positive,abdomen soft and non-tender without masses, organomegaly or hernias noted. Extremities:  very ridge movements in all extremities. No edema.  Psych:  flat affect, no interaction   Impression & Recommendations:  Problem # 1:  ALZHEIMER'S DISEASE (ICD-331.0) Assessment Deteriorated Pt continues to decline. She has end stage dementia.   Problem # 2:  PARKINSONISM (ICD-332.0) Assessment: Deteriorated very ridged. hands and arms are difficult to straighten.   Problem # 3:  WEIGHT LOSS (ICD-783.21) Pt has weight loss. Cont to try to keep her protein and nourishment up. She has Magic cup three times a day. Cont weekly weights.   Problem # 4:  DIABETES MELLITUS, TYPE II, WITH RENAL COMPLICATIONS (ICD-250.40) improved. CBG's range 63-200's. Last  BMET Glu was 108. not eating well. will consider decrease in Metformin soon.   Her updated medication list for this problem includes:    Lisinopril 20 Mg Tabs (Lisinopril) .Marland Kitchen... 1 tab by mouth daily    Adult Aspirin Ec Low Strength 81 Mg Tbec (Aspirin) .Marland Kitchen... 1 tab by mouth daily    Metformin Hcl 1000 Mg Tabs (Metformin hcl) .Marland Kitchen... Take one tab in a.m.  Problem # 5:  GLAUCOMA NOS (ICD-365.9) Assessment: Comment Only  cont Timolol and Xalatan  Problem # 6:  DEGENERATIVE JOINT DISEASE, KNEE (ICD-715.96) Assessment: Comment Only  Her updated medication list for this problem includes:    Adult Aspirin Ec Low Strength 81 Mg Tbec (Aspirin) .Marland Kitchen... 1 tab by mouth daily    Tylenol 8 Hour 650 Mg Tbcr (Acetaminophen) .Marland Kitchen... Take 1 tabs twice daily  Complete Medication List: 1)  Lisinopril 20 Mg Tabs (Lisinopril) .Marland Kitchen.. 1 tab by mouth daily 2)  Adult Aspirin Ec Low Strength 81 Mg Tbec (Aspirin) .Marland Kitchen.. 1 tab by mouth daily 3)  Metformin Hcl 1000 Mg Tabs (Metformin hcl) .... Take one tab in a.m. 4)  Miralax Powd (Polyethylene glycol 3350) .Marland Kitchen.. 17g in 8 oz water daily 5)  Tylenol 8 Hour 650 Mg Tbcr (Acetaminophen) .... Take 1 tabs twice daily 6)  Sinemet 25-100 Mg Tabs (Carbidopa-levodopa) .... Take one before lunch and dinner 7)  Megace Oral 40 Mg/ml Susp (Megestrol acetate) .... 20ml (800mg ) daily 8)  Multivitamins Tabs (Multiple vitamin) .... One daily 9)  Timoptic-xe 0.5 % Solg (Timolol maleate) .... One drop both eys daily 10)  Xalatan 0.005 % Soln (Latanoprost) .Marland Kitchen.. 1 drop both eyes daily 11)  Namenda 10 Mg Tabs (Memantine hcl) .... Two times a day 12)  Sarna Sensitive 1 % Lotn (Pramoxine hcl) .... Apply to affected area two times a day 13)  Eucerin Crea (Skin protectants, misc.) .... Apply two times a day as needed to affected areas  Other Orders: Hemoccult Cards (Take Home) (Hemoccult Cards)   -  Date:  08/15/2009    Weight 131.6  Date:  08/04/2009    Weight 131  Date:  07/21/2009     NA 140    K 3.8    CREAT 1.06    GLU 108    Prevention & Chronic Care Immunizations   Influenza vaccine: given  (04/22/2008)   Influenza vaccine due: 04/22/2009    Tetanus booster: 05/27/2003: given   Tetanus booster due: 05/26/2013    Pneumococcal vaccine: given  (12/04/2007)   Pneumococcal vaccine due: None    H. zoster vaccine: Not documented  Colorectal Screening   Hemoccult: Not documented   Hemoccult action/deferral: Ordered  (08/04/2009)    Colonoscopy: Not documented   Colonoscopy due: Not Indicated  Other Screening   Pap smear: done  (08/18/2006)   Pap smear due: Not Indicated    Mammogram: BI-RADS CATEGORY 2:  Benign  finding(s).^MM DIGITAL DIAG LTD L  (06/12/2008)   Mammogram action/deferral: Deferred  (08/04/2009)   Mammogram due: 06/02/2009    DXA bone density scan: Not documented   DXA bone density action/deferral: Deferred  (08/04/2009)   Smoking status: never  (07/15/2008)  Diabetes Mellitus   HgbA1C: 5.4  (03/12/2009)   HgbA1C action/deferral: Not indicated  (08/04/2009)   Hemoglobin A1C due: 09/03/2008    Eye exam: normal  (03/29/2007)   Diabetic eye exam action/deferral: Not indicated  (08/04/2009)   Eye exam due: 03/28/2008    Foot exam: yes  (07/15/2008)   Foot exam action/deferral: Not indicated   High risk foot: Not documented   Foot care education: completed  (04/22/2008)   Foot exam due: 04/22/2009    Urine microalbumin/creatinine ratio: Not documented   Urine microalbumin action/deferral: Not indicated    Diabetes flowsheet reviewed?: Yes   Progress toward A1C goal: At goal  Lipids   Total Cholesterol: Not documented   Lipid panel action/deferral: Not indicated   LDL: Not documented   LDL Direct: 139  (10/16/2007)   HDL: Not documented   Triglycerides: Not documented  Hypertension   Last Blood Pressure: 132 / 76  (08/04/2009)   Serum creatinine: 1.06  (07/21/2009)   Serum potassium 3.8  (07/21/2009)    Hypertension  flowsheet reviewed?: Yes   Progress toward BP goal: At goal  Self-Management Support :   Personal Goals (by the next clinic visit) :     Personal A1C goal: 6  (08/04/2009)     Personal blood pressure goal: 130/80  (08/04/2009)     Personal LDL goal: 100  (08/04/2009)    Patient will work on the following items until the next clinic visit to reach self-care goals:     Medications and monitoring: take my medicines every day, check my blood sugar, check my blood pressure  (08/04/2009)    Diabetes self-management support: Not documented    Diabetes self-management support not done because: Not indicated  (08/04/2009)   Last diabetes self-management training by diabetes educator: completed    Hypertension self-management support: Not documented    Hypertension self-management support not done because: Not indicated  (08/04/2009)    Self-management comments: Pt lives in nursing home, these meds and measures are done for her.    Nursing Instructions: Provide Hemoccult cards with instructions (see order)

## 2010-04-27 NOTE — Miscellaneous (Signed)
Summary: med update  Clinical Lists Changes  Medications: Removed medication of MEGACE ORAL 40 MG/ML SUSP (MEGESTROL ACETATE) 20ml (800mg ) daily Removed medication of SINEMET 25-100 MG TABS (CARBIDOPA-LEVODOPA) Take one before lunch and dinner Added new medication of SINEMET 25-250 MG TABS (CARBIDOPA-LEVODOPA) three times a day prior to meals - Signed Rx of SINEMET 25-250 MG TABS (CARBIDOPA-LEVODOPA) three times a day prior to meals;  #1 x 0;  Signed;  Entered by: Christian Mate D;  Authorized by: Madelon Lips Pharm D;  Method used: Historical    Prescriptions: SINEMET 25-250 MG TABS (CARBIDOPA-LEVODOPA) three times a day prior to meals  #1 x 0   Entered and Authorized by:   Christian Mate D   Signed by:   Madelon Lips Pharm D on 09/16/2009   Method used:   Historical   RxID:   [id number]

## 2010-04-28 ENCOUNTER — Encounter: Payer: Self-pay | Admitting: Family Medicine

## 2010-04-28 DIAGNOSIS — R634 Abnormal weight loss: Secondary | ICD-10-CM

## 2010-04-28 DIAGNOSIS — F0280 Dementia in other diseases classified elsewhere without behavioral disturbance: Secondary | ICD-10-CM

## 2010-04-28 DIAGNOSIS — G20 Parkinson's disease: Secondary | ICD-10-CM

## 2010-04-28 DIAGNOSIS — G309 Alzheimer's disease, unspecified: Secondary | ICD-10-CM

## 2010-04-28 DIAGNOSIS — I10 Essential (primary) hypertension: Secondary | ICD-10-CM

## 2010-04-29 NOTE — Assessment & Plan Note (Signed)
Summary: Nusing Home Visit   Vital Signs:  Patient profile:   77 year old female Weight:      131.2 pounds BMI:     20.62 Temp:     97 degrees F Pulse rate:   64 / minute Resp:     20 per minute BP sitting:   132 / 68  Primary Care Provider:  Jamie Brookes MD  CC:  nursing home visit.  History of Present Illness: Pt is an Alzheimer's patient who is staying on a steady course. She does not recognize me nor is she verbal but appears to be unchanged from previous visits.   Current Medications (verified): 1)  Adult Aspirin Ec Low Strength 81 Mg Tbec (Aspirin) .Marland Kitchen.. 1 Tab By Mouth Daily 2)  Miralax  Powd (Polyethylene Glycol 3350) .Marland Kitchen.. 17g in 8 Oz Water Daily 3)  Tylenol 8 Hour 650 Mg  Tbcr (Acetaminophen) .... Take 1 Tabs Twice Daily 4)  Multivitamins  Tabs (Multiple Vitamin) .... One Daily 5)  Timoptic-Xe 0.5 % Solg (Timolol Maleate) .... One Drop Both Eys Daily 6)  Xalatan 0.005 % Soln (Latanoprost) .Marland Kitchen.. 1 Drop Both Eyes Daily 7)  Sinemet 25-250 Mg Tabs (Carbidopa-Levodopa) .... Three Times A Day Prior To Meals 8)  Lisinopril 10 Mg Tabs (Lisinopril) .... Once Daily 9)  Ergocalciferol 50000 Unit Caps (Ergocalciferol) .... Once Monthly 10)  Namenda 10 Mg Tabs (Memantine Hcl) .... Two Times A Day  Allergies (verified): 1)  ! Penicillin 2)  Aricept (Donepezil Hcl) 3)  Norvasc  Physical Exam  General:  Well-developed,well-nourished,in no acute distress; alert,appropriate and cooperative throughout examination Head:  masked face Lungs:  Normal respiratory effort, chest expands symmetrically. Lungs are clear to auscultation, no crackles or wheezes. Heart:  Normal rate and regular rhythm. S1 and S2 normal without gallop, murmur, click, rub or other extra sounds. Abdomen:  Bowel sounds positive,abdomen soft and non-tender without masses, organomegaly or hernias noted. Msk:  hands are in splints, can move hands to face Psych:  very flat affect, no recognition of me.    Impression  & Recommendations:  Problem # 1:  ALZHEIMER'S DISEASE (ICD-331.0) severe, does not recognize anyone. Not having a response to the treatment.   Problem # 2:  PARKINSONISM (ICD-332.0) same as above, not improving  Problem # 3:  HYPERTENSION, BENIGN (ICD-401.1) Cont to do well on current regimine. No change.   Her updated medication list for this problem includes:    Lisinopril 10 Mg Tabs (Lisinopril) ..... Once daily  Problem # 4:  DIABETES MELLITUS, TYPE II, WITH RENAL COMPLICATIONS (ICD-250.40) controlled with diet. Last Clu done 11/18 was 106  Her updated medication list for this problem includes:    Adult Aspirin Ec Low Strength 81 Mg Tbec (Aspirin) .Marland Kitchen... 1 tab by mouth daily    Lisinopril 10 Mg Tabs (Lisinopril) ..... Once daily  Complete Medication List: 1)  Adult Aspirin Ec Low Strength 81 Mg Tbec (Aspirin) .Marland Kitchen.. 1 tab by mouth daily 2)  Miralax Powd (Polyethylene glycol 3350) .Marland Kitchen.. 17g in 8 oz water daily 3)  Tylenol 8 Hour 650 Mg Tbcr (Acetaminophen) .... Take 1 tabs twice daily 4)  Multivitamins Tabs (Multiple vitamin) .... One daily 5)  Timoptic-xe 0.5 % Solg (Timolol maleate) .... One drop both eys daily 6)  Xalatan 0.005 % Soln (Latanoprost) .Marland Kitchen.. 1 drop both eyes daily 7)  Sinemet 25-250 Mg Tabs (Carbidopa-levodopa) .... Three times a day prior to meals 8)  Lisinopril 10 Mg Tabs (Lisinopril) .... Once  daily 9)  Ergocalciferol 50000 Unit Caps (Ergocalciferol) .... Once monthly 10)  Namenda 10 Mg Tabs (Memantine hcl) .... Two times a day

## 2010-05-05 NOTE — Miscellaneous (Signed)
Summary: immunization update  Clinical Lists Changes  Observations: Added new observation of LIPID PROGRS: N/A (04/27/2010 20:12) Added new observation of LIPID FSREVW: N/A (04/27/2010 20:12) Added new observation of FLU VAX: Fluvax 3+ (02/01/2010 20:14)      Prevention & Chronic Care Immunizations   Influenza vaccine: Fluvax 3+  (02/01/2010)   Influenza vaccine due: 04/22/2009    Tetanus booster: 05/27/2003: given   Tetanus booster due: 05/26/2013    Pneumococcal vaccine: given  (12/04/2007)   Pneumococcal vaccine due: None    H. zoster vaccine: Not documented  Colorectal Screening   Hemoccult: Not documented   Hemoccult action/deferral: Not indicated  (10/12/2009)    Colonoscopy: Not documented   Colonoscopy due: Not Indicated  Other Screening   Pap smear: done  (08/18/2006)   Pap smear due: Not Indicated    Mammogram: BI-RADS CATEGORY 2:  Benign finding(s).^MM DIGITAL DIAG LTD L  (06/12/2008)   Mammogram action/deferral: Deferred  (08/04/2009)   Mammogram due: 06/02/2009    DXA bone density scan: Not documented   DXA bone density action/deferral: Not indicated  (10/12/2009)   Smoking status: never  (07/15/2008)  Diabetes Mellitus   HgbA1C: 6.1  (12/25/2009)   HgbA1C action/deferral: Not indicated  (08/04/2009)   Hemoglobin A1C due: 09/03/2008    Eye exam: normal  (03/29/2007)   Diabetic eye exam action/deferral: Not indicated  (08/04/2009)   Eye exam due: 03/28/2008    Foot exam: yes  (07/15/2008)   Foot exam action/deferral: Not indicated   High risk foot: Not documented   Foot care education: completed  (04/22/2008)   Foot exam due: 04/22/2009    Urine microalbumin/creatinine ratio: Not documented   Urine microalbumin action/deferral: Not indicated  Lipids   Total Cholesterol: Not documented   Lipid panel action/deferral: Not indicated   LDL: Not documented   LDL Direct: 139  (10/16/2007)   HDL: Not documented   Triglycerides: Not  documented  Hypertension   Last Blood Pressure: 132 / 68  (03/26/2010)   Serum creatinine: 1.06  (07/21/2009)   Serum potassium 3.8  (07/21/2009)  Self-Management Support :   Personal Goals (by the next clinic visit) :     Personal A1C goal: 8  (10/12/2009)     Personal blood pressure goal: 130/80  (08/04/2009)     Personal LDL goal: 100  (08/04/2009)    Diabetes self-management support: Not documented    Diabetes self-management support not done because: Not indicated  (08/04/2009)   Last diabetes self-management training by diabetes educator: completed    Hypertension self-management support: Not documented    Hypertension self-management support not done because: Not indicated  (08/04/2009)    Influenza Immunization History:    Influenza # 1:  Fluvax 3+ (02/01/2010)

## 2010-05-05 NOTE — Assessment & Plan Note (Signed)
Summary: NH visit   Vital Signs:  Patient profile:   77 year old female Height:      67 inches  Primary Care Provider:  Jamie Brookes MD   History of Present Illness: No voiced complaints and no problems per staff. Said,"Uh, uh" when asked about pain.   Allergies: 1)  ! Penicillin 2)  Aricept (Donepezil Hcl) 3)  Norvasc  Physical Exam  General:  Eyes closed tightly. Will open slightly  Mouth:  Pursed llips, held closes Breasts:  No mass, nodules, thickening, tenderness, bulging, retraction, inflamation, nipple discharge or skin changes noted.   Heart:  Normal rate and regular rhythm. S1 and S2 normal without gallop, murmur, click, rub or other extra sounds. Abdomen:  soft, no masses, no guarding, and no rigidity.   Msk:  hands are grasping soft cylinders Extremities:  No edema Neurologic:  . Masked facies. Pos glabellar reflex. Lead pipe rigidity   Impression & Recommendations:  Problem # 1:  WEIGHT LOSS (ICD-783.21) Continues slow decline Orders: Provider Misc Charge- [facility] (Misc)  Problem # 2:  ALZHEIMER'S DISEASE (ICD-331.0) Assessment: Deteriorated  Orders: Provider Misc Charge- [facility] (Misc)  Problem # 3:  PARKINSONISM (ICD-332.0)  Orders: Provider Misc Charge- [facility] (Misc)  Problem # 4:  HYPERTENSION, BENIGN (ICD-401.1) controlled Her updated medication list for this problem includes:    Lisinopril 10 Mg Tabs (Lisinopril) ..... Once daily  Orders: Provider Misc Charge- [facility] (Misc)  Complete Medication List: 1)  Adult Aspirin Ec Low Strength 81 Mg Tbec (Aspirin) .Marland Kitchen.. 1 tab by mouth daily 2)  Miralax Powd (Polyethylene glycol 3350) .Marland Kitchen.. 17g in 8 oz water daily 3)  Tylenol 8 Hour 650 Mg Tbcr (Acetaminophen) .... Take 1 tabs twice daily 4)  Multivitamins Tabs (Multiple vitamin) .... One daily 5)  Timoptic-xe 0.5 % Solg (Timolol maleate) .... One drop both eys daily 6)  Xalatan 0.005 % Soln (Latanoprost) .Marland Kitchen.. 1 drop both eyes daily 7)  Sinemet 25-250 Mg Tabs  (Carbidopa-levodopa) .... Three times a day prior to meals 8)  Lisinopril 10 Mg Tabs (Lisinopril) .... Once daily 9)  Ergocalciferol 50000 Unit Caps (Ergocalciferol) .... Once monthly 10)  Namenda 10 Mg Tabs (Memantine hcl) .... Two times a day   Orders Added: 1)  Provider Misc Charge- [facility] [Misc]     Prevention & Chronic Care Immunizations   Influenza vaccine: Fluvax 3+  (02/01/2010)   Influenza vaccine due: 04/22/2009    Tetanus booster: 05/27/2003: given   Tetanus booster due: 05/26/2013    Pneumococcal vaccine: given  (12/04/2007)   Pneumococcal vaccine due: None    H. zoster vaccine: Not documented  Colorectal Screening   Hemoccult: Not documented   Hemoccult action/deferral: Not indicated  (10/12/2009)    Colonoscopy: Not documented   Colonoscopy due: Not Indicated  Other Screening   Pap smear: done  (08/18/2006)   Pap smear due: Not Indicated    Mammogram: BI-RADS CATEGORY 2:  Benign finding(s).^MM DIGITAL DIAG LTD L  (06/12/2008)   Mammogram action/deferral: Deferred  (08/04/2009)   Mammogram due: 06/02/2009    DXA bone density scan: Not documented   DXA bone density action/deferral: Not indicated  (10/12/2009)   Smoking status: never  (07/15/2008)  Diabetes Mellitus   HgbA1C: 6.1  (12/25/2009)   HgbA1C action/deferral: Not indicated  (08/04/2009)   Hemoglobin A1C due: 09/03/2008    Eye exam: normal  (03/29/2007)   Diabetic eye exam action/deferral: Not indicated  (08/04/2009)   Eye exam due: 03/28/2008    Foot  exam: yes  (07/15/2008)   Foot exam action/deferral: Not indicated   High risk foot: Not documented   Foot care education: completed  (04/22/2008)   Foot exam due: 04/22/2009    Urine microalbumin/creatinine ratio: Not documented   Urine microalbumin action/deferral: Not indicated    Diabetes flowsheet reviewed?: Yes   Progress toward A1C goal: Unchanged  Lipids   Total Cholesterol: Not documented   Lipid panel action/deferral: Not  indicated   LDL: Not documented   LDL Direct: 139  (10/16/2007)   HDL: Not documented   Triglycerides: Not documented  Hypertension   Last Blood Pressure: 132 / 68  (03/26/2010)   Serum creatinine: 1.06  (07/21/2009)   Serum potassium 3.8  (07/21/2009)    Hypertension flowsheet reviewed?: Yes   Progress toward BP goal: At goal  Self-Management Support :   Personal Goals (by the next clinic visit) :     Personal A1C goal: 8  (10/12/2009)     Personal blood pressure goal: 130/80  (08/04/2009)     Personal LDL goal: 100  (08/04/2009)    Diabetes self-management support: Not documented    Diabetes self-management support not done because: Not indicated  (08/04/2009)   Last diabetes self-management training by diabetes educator: completed    Hypertension self-management support: Not documented    Hypertension self-management support not done because: Not indicated  (08/04/2009)

## 2010-05-06 ENCOUNTER — Other Ambulatory Visit: Payer: Self-pay | Admitting: Family Medicine

## 2010-05-14 ENCOUNTER — Other Ambulatory Visit: Payer: Self-pay | Admitting: Pharmacist

## 2010-05-14 DIAGNOSIS — M1710 Unilateral primary osteoarthritis, unspecified knee: Secondary | ICD-10-CM

## 2010-05-14 DIAGNOSIS — I10 Essential (primary) hypertension: Secondary | ICD-10-CM

## 2010-05-14 DIAGNOSIS — G20 Parkinson's disease: Secondary | ICD-10-CM

## 2010-05-14 DIAGNOSIS — G309 Alzheimer's disease, unspecified: Secondary | ICD-10-CM

## 2010-05-14 DIAGNOSIS — H409 Unspecified glaucoma: Secondary | ICD-10-CM

## 2010-06-09 ENCOUNTER — Other Ambulatory Visit: Payer: Self-pay | Admitting: Pharmacist

## 2010-06-25 ENCOUNTER — Non-Acute Institutional Stay: Payer: Self-pay | Admitting: Family Medicine

## 2010-06-30 ENCOUNTER — Encounter: Payer: Self-pay | Admitting: Home Health Services

## 2010-07-06 LAB — GLUCOSE, CAPILLARY
Glucose-Capillary: 118 mg/dL — ABNORMAL HIGH (ref 70–99)
Glucose-Capillary: 140 mg/dL — ABNORMAL HIGH (ref 70–99)
Glucose-Capillary: 143 mg/dL — ABNORMAL HIGH (ref 70–99)
Glucose-Capillary: 155 mg/dL — ABNORMAL HIGH (ref 70–99)
Glucose-Capillary: 249 mg/dL — ABNORMAL HIGH (ref 70–99)

## 2010-07-06 LAB — URINALYSIS, ROUTINE W REFLEX MICROSCOPIC
Bilirubin Urine: NEGATIVE
Glucose, UA: NEGATIVE mg/dL
Hgb urine dipstick: NEGATIVE
Ketones, ur: 15 mg/dL — AB
Ketones, ur: NEGATIVE mg/dL
Leukocytes, UA: NEGATIVE
Nitrite: NEGATIVE
Protein, ur: 100 mg/dL — AB
Protein, ur: 30 mg/dL — AB
Urobilinogen, UA: 1 mg/dL (ref 0.0–1.0)

## 2010-07-06 LAB — CBC
HCT: 29.6 % — ABNORMAL LOW (ref 36.0–46.0)
HCT: 30 % — ABNORMAL LOW (ref 36.0–46.0)
Hemoglobin: 10.6 g/dL — ABNORMAL LOW (ref 12.0–15.0)
Hemoglobin: 9.7 g/dL — ABNORMAL LOW (ref 12.0–15.0)
Hemoglobin: 9.9 g/dL — ABNORMAL LOW (ref 12.0–15.0)
MCHC: 32.6 g/dL (ref 30.0–36.0)
MCV: 74.7 fL — ABNORMAL LOW (ref 78.0–100.0)
Platelets: 172 10*3/uL (ref 150–400)
RBC: 4.03 MIL/uL (ref 3.87–5.11)
RBC: 4.46 MIL/uL (ref 3.87–5.11)
RDW: 15.2 % (ref 11.5–15.5)
RDW: 15.7 % — ABNORMAL HIGH (ref 11.5–15.5)
WBC: 7.5 10*3/uL (ref 4.0–10.5)
WBC: 8.1 10*3/uL (ref 4.0–10.5)
WBC: 8.4 10*3/uL (ref 4.0–10.5)

## 2010-07-06 LAB — POCT CARDIAC MARKERS
CKMB, poc: 1.4 ng/mL (ref 1.0–8.0)
Myoglobin, poc: 364 ng/mL (ref 12–200)
Troponin i, poc: <0.05 ng/mL (ref 0.00–0.09)

## 2010-07-06 LAB — BASIC METABOLIC PANEL
BUN: 4 mg/dL — ABNORMAL LOW (ref 6–23)
BUN: 7 mg/dL (ref 6–23)
CO2: 25 mEq/L (ref 19–32)
CO2: 26 mEq/L (ref 19–32)
Calcium: 8.4 mg/dL (ref 8.4–10.5)
Chloride: 108 mEq/L (ref 96–112)
Chloride: 110 mEq/L (ref 96–112)
Chloride: 112 mEq/L (ref 96–112)
Chloride: 114 mEq/L — ABNORMAL HIGH (ref 96–112)
Creatinine, Ser: 1.27 mg/dL — ABNORMAL HIGH (ref 0.4–1.2)
GFR calc Af Amer: 49 mL/min — ABNORMAL LOW (ref >60)
GFR calc Af Amer: >60 mL/min (ref >60)
GFR calc non Af Amer: 41 mL/min — ABNORMAL LOW (ref >60)
GFR calc non Af Amer: >60 mL/min (ref >60)
Glucose, Bld: 117 mg/dL — ABNORMAL HIGH (ref 70–99)
Glucose, Bld: 129 mg/dL — ABNORMAL HIGH (ref 70–99)
Glucose, Bld: 154 mg/dL — ABNORMAL HIGH (ref 70–99)
Glucose, Bld: 159 mg/dL — ABNORMAL HIGH (ref 70–99)
Potassium: 2.9 mEq/L — ABNORMAL LOW (ref 3.5–5.1)
Potassium: 3.5 mEq/L (ref 3.5–5.1)
Potassium: 3.5 mEq/L (ref 3.5–5.1)
Potassium: 3.6 mEq/L (ref 3.5–5.1)
Sodium: 140 mEq/L (ref 135–145)
Sodium: 142 mEq/L (ref 135–145)
Sodium: 142 mEq/L (ref 135–145)
Sodium: 150 mEq/L — ABNORMAL HIGH (ref 135–145)

## 2010-07-06 LAB — DIFFERENTIAL
Basophils Relative: 1 % (ref 0–1)
Eosinophils Absolute: 0.2 10*3/uL (ref 0.0–0.7)
Eosinophils Absolute: 0.3 10*3/uL (ref 0.0–0.7)
Lymphocytes Relative: 25 % (ref 12–46)
Lymphs Abs: 2.1 10*3/uL (ref 0.7–4.0)
Monocytes Absolute: 0.5 10*3/uL (ref 0.1–1.0)
Monocytes Relative: 5 % (ref 3–12)
Monocytes Relative: 6 % (ref 3–12)
Neutrophils Relative %: 66 % (ref 43–77)
Neutrophils Relative %: 72 % (ref 43–77)

## 2010-07-06 LAB — URINE CULTURE
Colony Count: NO GROWTH
Culture: NO GROWTH
Culture: NO GROWTH

## 2010-07-06 LAB — COMPREHENSIVE METABOLIC PANEL
ALT: 59 U/L — ABNORMAL HIGH (ref 0–35)
Albumin: 3.4 g/dL — ABNORMAL LOW (ref 3.5–5.2)
Alkaline Phosphatase: 50 U/L (ref 39–117)
Alkaline Phosphatase: 55 U/L (ref 39–117)
BUN: 12 mg/dL (ref 6–23)
Calcium: 8.9 mg/dL (ref 8.4–10.5)
Chloride: 113 mEq/L — ABNORMAL HIGH (ref 96–112)
Glucose, Bld: 102 mg/dL — ABNORMAL HIGH (ref 70–99)
Glucose, Bld: 105 mg/dL — ABNORMAL HIGH (ref 70–99)
Potassium: 2.6 mEq/L — CL (ref 3.5–5.1)
Potassium: 2.8 mEq/L — ABNORMAL LOW (ref 3.5–5.1)
Sodium: 152 mEq/L — ABNORMAL HIGH (ref 135–145)
Total Protein: 6.3 g/dL (ref 6.0–8.3)
Total Protein: 7.1 g/dL (ref 6.0–8.3)

## 2010-07-06 LAB — OSMOLALITY, URINE: Osmolality, Ur: 845 mOsm/kg (ref 390–1090)

## 2010-07-06 LAB — URINE MICROSCOPIC-ADD ON

## 2010-07-06 LAB — TSH: TSH: 1.991 u[IU]/mL (ref 0.350–4.500)

## 2010-07-08 LAB — GLUCOSE, CAPILLARY: Glucose-Capillary: 123 mg/dL — ABNORMAL HIGH (ref 70–99)

## 2010-07-15 ENCOUNTER — Non-Acute Institutional Stay: Payer: Self-pay | Admitting: Family Medicine

## 2010-07-15 ENCOUNTER — Encounter: Payer: Self-pay | Admitting: Family Medicine

## 2010-07-15 DIAGNOSIS — E2749 Other adrenocortical insufficiency: Secondary | ICD-10-CM | POA: Insufficient documentation

## 2010-07-15 DIAGNOSIS — E1129 Type 2 diabetes mellitus with other diabetic kidney complication: Secondary | ICD-10-CM

## 2010-07-15 DIAGNOSIS — G20 Parkinson's disease: Secondary | ICD-10-CM

## 2010-07-15 NOTE — Assessment & Plan Note (Signed)
Pt continues to be mostly non-verbal.  She did answer a couple of questions this time but she was likely just repeating things back to me.  Plan to continue her meds at this time.

## 2010-07-15 NOTE — Progress Notes (Signed)
Pt is doing about the same as the last time I saw her.   Alzheimer's/Parkinsonism: Pt likely has a combination of both and is treated with Sinemet. Her Namenda was discontinued as it did not seem to be helping.  She is mostly non-verbal.   Her Diabetes is well controlled. Her   ROS: Pt can not express herself so ROs can not be completed. Noted in chart was some question of decreased swallowing in Feb that resolved.   TSH: 4.759 (slighlty elevated) on 05-10-10.   PE: Gen: NAD, sitting in chair.  HEENT: no abnormalities of the head, [place]/AT, expression of face is flat, there is minimal drool from mouth.  CV: RRR, no murmur Pulm: CTAB, no wheezes Abd: soft, non-distended, + BS Ext: thin legs, no edema, hands had contractures. Pt is holding "carrot" in her contracted hands.

## 2010-07-15 NOTE — Assessment & Plan Note (Signed)
Well controlled off meds. Continue current regimen.

## 2010-07-25 ENCOUNTER — Non-Acute Institutional Stay: Payer: Self-pay | Admitting: Family Medicine

## 2010-07-25 DIAGNOSIS — F0280 Dementia in other diseases classified elsewhere without behavioral disturbance: Secondary | ICD-10-CM

## 2010-07-25 DIAGNOSIS — I10 Essential (primary) hypertension: Secondary | ICD-10-CM

## 2010-07-25 DIAGNOSIS — R634 Abnormal weight loss: Secondary | ICD-10-CM

## 2010-07-25 DIAGNOSIS — G20 Parkinson's disease: Secondary | ICD-10-CM

## 2010-07-25 NOTE — Assessment & Plan Note (Addendum)
Late stage. Bed to [facility] chair activity. Being fed.

## 2010-07-25 NOTE — Assessment & Plan Note (Signed)
Low normal. Off Lisinopril

## 2010-07-25 NOTE — Assessment & Plan Note (Signed)
Recently stabilized

## 2010-07-25 NOTE — Assessment & Plan Note (Signed)
End stage. Family not willing to limit end of life interventions, despite discussions from [facility].

## 2010-07-27 ENCOUNTER — Encounter: Payer: Self-pay | Admitting: Family Medicine

## 2010-08-10 NOTE — Discharge Summary (Signed)
[name], [name]               ACCOUNT NO.:  [id number]   MEDICAL RECORD NO.:  [id number]          PATIENT TYPE:  INP   LOCATION:  6709                         FACILITY:  [facility]   PHYSICIAN:  Betti D. Pecola Leisure, M.D.   DATE OF BIRTH:  Nov 03, 1928   DATE OF ADMISSION:  06/08/2006  DATE OF DISCHARGE:  06/14/2006                               DISCHARGE SUMMARY   ADMISSION DIAGNOSES:  1. Mental status changes.  2. Dementia.  3. Type 2 diabetes.  4. Hypertension.  5. Generalized weakness.   This patient is a 77 year old African American female known to my  practice with a 2 to 3-day history of increased confusion and weakness.  The patient had been seen in my office on June 08, 2006 for the same  symptoms and generalized weakness.  Her family states that she was not  herself.  She had had no falls, no fractures, or any type of bodily  trauma in the past 6 months to a year.  Her family described her as  often wandering through the house and being very forgetful and irritable  and physically aggressive towards her family at times.  The patient did  have a slight cough but no fever, chills, headaches, sinus pressure, UTI  symptoms, or shortness of breath.  Her gait was fairly stable at the  time of her visit.  In addition to her type 2 diabetes and hypertension,  the patient does have reflux and a history of coronary artery disease  and glaucoma.  The patient was placed in the hospital to further  investigate her acute mental status changes.   PERTINENT LABS ON ADMISSION:  Sodium 120, potassium 2.8, BUN 17,  creatinine 0.85, calcium 8.7.  Cardiac enzymes were negative.  CT of her  head revealed no acute changes with some atrophic changes in the frontal  lobe.  Urinalysis was negative.  EKG negative.  Chest x-ray clear.   The patient was given IV normal saline with 4 runs of potassium to  replenish her sodium and potassium deficiencies.  It was understood that  her acute mental status  change was attributable to her hyponatremia and  hypokalemia.  In addition, her magnesium, phosphorus, TSH, vitamin B12,  folate, and acid levels were checked and found to be normal.   Psychiatry was consulted to further evaluate this patient and noted that  her symptoms were also consistent with a metabolic change as well as  dementia.  We on to test her ANA and sed rate which were all normal as  well.   With just routine IV sodium and potassium replacement, the patient did  improve; however, she did have some occasions where she would have some  acute delirium for which she was given Ativan at times and then  trazodone which helped her sleep better and improve her acute psychosis.   The only other issue in her hospitalization was constipation for which  she was given MiraLax to alleviate that problem as well.  There were no  significant changes in her overall health, except for clearing of her  mental status which  by the time she was discharged had normalized to her  routine mild dementia.   The patient was discharged home on June 11, 2006 in stable condition.   She was to follow up with Dr. Pecola Leisure in 1 week and was to continue on the  trazodone 25 mg at night.  There was no need for psychiatry followup  outpatient.  She was also to resume her routine medications for her  diabetes and hypertension.           ______________________________  Isidoro Donning Pecola Leisure, M.D.     BDR/MEDQ  D:  08/24/2006  T:  08/24/2006  Job:  [id number]

## 2010-08-10 NOTE — Discharge Summary (Signed)
[name], [name]               ACCOUNT NO.:  [id number]   MEDICAL RECORD NO.:  [id number]          PATIENT TYPE:  INP   LOCATION:  5003                         FACILITY:  [facility]   PHYSICIAN:  Leighton Roach McDiarmid, M.D.DATE OF BIRTH:  April 12, 1928   DATE OF ADMISSION:  08/19/2008  DATE OF DISCHARGE:  08/22/2008                               DISCHARGE SUMMARY   PRIMARY CARE PHYSICIAN:  Wayne A. Sheffield Slider, M.D., at [facility].   DISCHARGE DIAGNOSES:  1. Hypernatremia, resolved.  2. Altered mental status, resolved.  3. Dementia.  4. Possible Adrenal insufficiency based on one random low morning      serum cortisol level, if true possibly secondary to Megace.  5. Diabetes.  6. Hypertension.  7. Hypokalemia, resolved.  8. Oral candidiasis.  9. Anemia.  10.Weight loss.   DISCHARGE MEDICATIONS:  We made several changes to her home regimen.  The medications we are sending her home on are as follows:  1. Lisinopril 20 mg p.o. daily.  Please note, this is a reduced dose      from prior.  2. Aspirin 81 mg p.o. daily.  3. Diltiazem long-acting 240 mg p.o. daily.  4. Namenda 10 mg p.o. b.i.d.  5. Metformin 1000 mg p.o. daily.  6. MiraLax 17 g dissolved in 8 ounces of water p.o. daily.  7. Nystatin swish and swallow 5 mL p.o. q.i.d. x7 more days, which      will put her last dose on June 4.  8. Ensure pudding p.o. t.i.d.  9. Barrier cream to sacral skin tears b.i.d.  10.Trazodone 25 mg p.o. q.h.s.  Please note, that is a decreased dose      from prior.  11.We would like to continue her on Remeron 30 mg p.o. q.h.s.   We discontinued the following medications:  Megace, Lantus and Lasix.   BRIEF REASON FOR HOSPITALIZATION:  The patient is a 77 year old female  with Alzheimer disease who was brought in by her family for increasing  confusion and weakness.  Had been progressing somewhat over the past  year but acutely worse the 2-3 weeks prior to hospitalization.  She was  found to be hypernatremic with a sodium of 152 and hypokalemic with a  potassium of 2.6.  She was thought to be quite dehydrated as well.   PERTINENT LABS AND STUDIES:  On admission, UA did not show signs of  infection.  It did show a specific gravity of 1.034.  her white blood  cell count was 8.4, her hemoglobin was 10.6, which is relatively stable  for her.  Her sodium was high at 152 and potassium was low at 2.6,  creatinine was 1.10.  A chest x-ray showed mild left lower lobe  atelectasis.  A head CT showed progression of brain atrophy but no acute  findings.  Point-of-care cardiac enzymes were negative.  A TSH performed  was within normal limits at 1.991.  Urine sodium and urine creatinine  were obtained, which showed a FENa of less than 1%.  An a.m. cortisol  was done on the morning of May  27, which was quite low at 0.8.  A urine  culture showed no growth.   HOSPITAL COURSE BY PROBLEM:  1. Altered mental status and confusion.  This was thought to be due to      a combination of factors, most acutely dehydration and      hypernatremia causing delirium but there may very well be a      component of worsening of her all Alzheimer disease that accounted      for the more gradual decline.  We corrected her sodium deficit with      half-normal saline and within 48 hours her sodium was within normal      limits.  We corrected her hypokalemia by repleting her potassium      and the patient appears to have returned to her baseline state      within 48 hours of admission.  2. Hypernatremia.  As above, we think this was due to decreased fluid      intake, probably exacerbated by her Lasix.  We hydrated her with      half-normal saline, we stopped her Lasix, and again, within 48      hours her sodium was within normal limits.  3. Adrenal insufficiency.  Like I said, her a.m. cortisol was quite      low at 0.8.  This is likely a transient adrenal insufficiency      likely due to the Megace  that she has been on which had been to      increase her appetite, so we discontinued the Megace and we would      recommend a repeat a.m. cortisol in about 2 weeks from the time of      discharge.  We did not do any further workup of the adrenal      insufficiency at this time, and that can be pursued at the nursing      home if the geriatric team sees fit.  4. Type 2 diabetes.  The patient had had decreased p.o. intake so we      held her diabetic medicines.  The patient's sugars were pretty well-      controlled during her hospital stay, less than 200 generally on      sliding scale insulin.  We stopped her sliding scale insulin, added      back in her metformin, and at this time since her sugars are doing      well without Lantus, we are going to stop the Lantus and may need      to add that back in at the nursing home as appropriate.  Concerned      because the patient is not able to express symptoms of hypoglycemia      due to her Alzheimer disease and so I think it is okay for her run      a little bit higher and we do not want to risk her going      hypoglycemic, so on discharge she is only to be on metformin.  5. Hypertension.  During her hospital stay we continued her diltiazem      but held her lisinopril due to her dehydration and held her Lasix      due to her electrolyte abnormalities.  Her blood pressures remained      mostly in the 160s systolic with only the diltiazem.  At the time      of discharge I am going to add back  in her lisinopril but at half a      dose and that can be titrated up by the geriatric team as they deem      appropriate, and were going to stop her Lasix, so she will go home      on 240 of long-acting diltiazem and 20 mg daily of lisinopril.  6. Hypokalemia.  As was discussed earlier, likely due to decreased      p.o. intake and addition of Lasix.  We held her Lasix.  We repleted      her with oral potassium and put some potassium in her replacement       fluids and her potassium normalized, at the time of discharge is      3.5.  She may need a BMET in a few weeks to make sure she may      maintains since she is low normal at this time.  7. Oral candidiasis.  She was placed on nystatin swish and swallow.      We recommend about 10 days of therapy total.  This will put her      finishing her last dose, I believe, on June 4.  8. Anemia.  This has remained stable and no workup or treatment was      done for this.  9. Weight loss.  We consulted Nutrition, who recommended Ensure      pudding t.i.d.  During her hospitalization we continued her Megace      until her cortisol came back low, so at the time of discharge we      decided to leave her home dose of Remeron and stop her Megace in      hopes that it will correct her adrenal insufficiency.  10.Alzheimer disease.  We continued her Namenda.  11.Placement.  The patient's family requested that she be placed in a      nursing home as they are no longer able to safely care for her at      home.  They prefer [facility].  The patient has received a bed at      [facility] and will transfer there this afternoon, and that is      consistent with the recommendations from a of PT/OT consult that we      sought.   FOLLOW-UP ISSUES:  Probably a repeat BMET within the next week to just  take a look at her sodium and potassium; a repeat a.m. cortisol in about  2 weeks to see if her adrenal insufficiency is resolving off of her  Megace; and then titration of her diabetic and blood pressure  medications as the geriatric team deems appropriate.      Asher Muir, MD  Electronically Signed      Leighton Roach McDiarmid, M.D.  Electronically Signed    SO/MEDQ  D:  08/22/2008  T:  08/22/2008  Job:  [id number]   cc:   Deniece Portela A. Sheffield Slider, M.D.

## 2010-08-13 NOTE — H&P (Signed)
[name], SURRATT                           ACCOUNT NO.:  [id number]   MEDICAL RECORD NO.:  [id number]                   PATIENT TYPE:  INP   LOCATION:  1829                                 FACILITY:  [facility]   PHYSICIAN:  Cristy Hilts. [name], M.D.                  DATE OF BIRTH:  1928/12/19   DATE OF ADMISSION:  04/17/2003  DATE OF DISCHARGE:  04/17/2003                                HISTORY & PHYSICAL   REFERRING PHYSICIAN:  [facility], [address], Dr. Earlene Plater.   CHIEF COMPLAINT:  Chest pain.   HISTORY OF PRESENT ILLNESS:  The patient is a 77 year old black female who  was seen in the ER on April 06, 2003, with chest pain.  CKs and troponins  were negative.  EKG was normal except for some bradycardia.  She was seen by  Dr. Elsie Lincoln on April 04, 2003, and scheduled for a three-day event monitor  recorder, 2-D echocardiogram and Cardiolite study.   Today she developed chest pain after getting out of bed.  She called her  daughter and was sent to the ER.  Her chest pain lasted until she got to the  ER.  She had mild shortness of breath, some mild nausea.  She initially  reported no chest pain since arrival in the ER.  She had no diaphoresis.  Since she has been seen by Dr. [name], she has had reoccurrence of her chest  pain which extends up into her neck and her jaw.   Workup at [facility] included a 2-D echocardiogram  which showed mild LVH with normal EF of 60 to 65%, thickened aortic valve.  No aortic stenosis or aortic regurgitation.  There was trivial mitral  regurgitation and thickened mitral valve.   Cardiolite showed mild inferolateral wall ischemia toward the apex.  There  was normal wall motion and endocardial thickening with ejection fraction of  76%.  EKG in the ER shows sinus rhythm, PR intervals at 146 msec.  There are  no ST or T wave changes.  Point-of-care myoglobin is 55.  CK-MB was 2.3, and  troponin was less than 0.05.  Sodium 141,  potassium 3.5, chloride 108, BUN  9, glucose 109.  Hemoglobin 13.6, hematocrit 40, otherwise pending.   PAST MEDICAL HISTORY:  1. Adult-onset diabetes mellitus, non-insulin-dependent, currently diet     controlled.  2. Hypertension.  3. Positive H. pylori/GERD/esophagitis with 20-pound weight loss in the last     30 to 60 days.   CURRENT MEDICATIONS:  1. Aceon 8 mg daily.  2. Prilosec 30 mg daily.  3. Glucotrol 5 mg a.c.  She has been off this for two weeks for low blood     sugars.  4. She just completed two weeks of Biaxin, Flagyl, and Prevacid for H.     pylori.   ALLERGIES:  PENICILLIN causes swelling.   HABITS:  ETOH:  None.  Cigarettes:  None.  Drugs:  None.   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  She  has five children, one deceased last year with breast cancer.   FAMILY HISTORY:  Father unknown.  Mother died at age 24 of  old age.  She  has a grandfather who lives to be 101.  She has one brother who is deceased  with alcohol abuse and one sister who has a stroke and history of alcohol  abuse, living at age 70.   REVIEW OF SYSTEMS:  CARDIOVASCULAR:  Negative except for some occasional  dizziness when she first gets up.  No history of strokes or TIAs.  PULMONARY:  No PND nor orthopnea, no coughing, no wheezing.  CARDIAC: New  pain.  No other prior discomfort like this.  GI:  Positive for GERD, loose  stools in the a.m. only now.  Diarrhea is much improved.  GU:  Negative.  EXTREMITIES:  Negative.   PHYSICAL EXAMINATION:  GENERAL:  This is a black female in no acute  distress.  VITAL SIGNS:  Blood pressure 176/77, heart rate 52, respirations 24, O2  saturation 100% on nasal cannula.  HEENT:  Head: Normocephalic.  Eyes:  PERRLA.  EOMs.  Fundi not visualized.  Ears, nose, throat, and mouth grossly  within normal limits.  NECK:  No bruits, no lymphadenopathy, no thyromegaly.  CHEST:  Clear to auscultation and percussion.  HEART:  Normal  S1 and S2.  Positive S4.  There is a 2/6 systolic murmur best  heard at the base and left sternal border.  ABDOMEN:  Soft, nontender.  Positive bowel sounds.  No palpable  hepatosplenomegaly.  GU/RECTAL:  Deferred.  Not medically indicated.  EXTREMITIES:  No clubbing, cyanosis, or edema.  Pulses are +2 and equal  throughout the distribution bilaterally.  NEUROLOGIC:  No focal deficits.   IMPRESSION:  1. Unstable angina.  2. Recent history of Helicobacter pylori/gastroesophageal reflux     disease/esophagitis.  3. Adult-onset diabetes mellitus, non-insulin dependent.  4. Hypertension.  5. Recent weight loss.   PLAN:  She was seen and evaluated by Dr. [name].  He plans to place her on  heparin, nitroglycerin, with cardiac catheterization today.      Eber Hong, P.A.                 Cristy Hilts. [name], M.D.    WDJ/MEDQ  D:  04/17/2003  T:  04/17/2003  Job:  [id number]   cc:   Dr. Earlene Plater, [facility] [address]

## 2010-08-13 NOTE — Discharge Summary (Signed)
NAMEALEXCIS, [name]                           ACCOUNT NO.:  [id number]   MEDICAL RECORD NO.:  [id number]                   PATIENT TYPE:  INP   LOCATION:  3735                                 FACILITY:  [facility]   PHYSICIAN:  Madaline Savage, M.D.             DATE OF BIRTH:  March 17, 1929   DATE OF ADMISSION:  06/17/2003  DATE OF DISCHARGE:  06/19/2003                                 DISCHARGE SUMMARY   ADDENDUM:  Ms. Arias apparently has already has an OB/GYN doctor and is  scheduled to have some surgery.  The abnormality noted on CT has been  evaluated already.      Abelino Derrick, P.A.                      Madaline Savage, M.D.    Lenard Lance  D:  06/19/2003  T:  06/19/2003  Job:  [id number]

## 2010-08-13 NOTE — Discharge Summary (Signed)
[name], [name]                           ACCOUNT NO.:  [id number]   MEDICAL RECORD NO.:  [id number]                   PATIENT TYPE:  INP   LOCATION:  3735                                 FACILITY:  [facility]   PHYSICIAN:  Darlin Priestly, M.D.             DATE OF BIRTH:  06-30-1928   DATE OF ADMISSION:  DATE OF DISCHARGE:  06/19/2003                                 DISCHARGE SUMMARY   DISCHARGE DIAGNOSES:  1. Symptomatic bradycardia, status post elective pacemaker this admission.  2. Gastroesophageal reflux disease.  3. Normal coronaries, January, 2005, with good left ventricular function.  4. Non-insulin-dependent diabetes mellitus.  5. Hypertension.  6. PENICILLIN allergy.   HOSPITAL COURSE:  [name] is a 77 year old female followed by Dr. Elsie Lincoln.  She had some chest pain in January and had a catheterization which  apparently was normal.  She did have a presyncopal spell and had some  bradycardia.  Monitor as an outpatient showed heart rates in the 40s with  symptoms.  She was admitted for elective pacemaker.  On June 18, 2003, she  underwent implantation of an Medtronic Impulse generator without  complications.  Her followup chest x-ray is pending, if this is unremarkable  we will let her go home later today.  She does have a history of non-insulin-  dependent diabetes mellitus but had not been on Glucotrol for a week or so.  This was resumed.  She also had run out of her Nexium, she had apparently  been seen by [facility] and treated for Helicobacter pylori gastritis.  We  asked her to resume over the counter Prilosec or Zantac.   DISCHARGE MEDICATIONS:  1. Norvasc 5 mg daily.  2. Mavik 4 mg daily.  3. Prilosec over the counter daily.  4. Glucotrol 2.5 mg daily.   LABORATORY DATA:  Sodium 140, potassium 3.5, BUN 12, creatinine 0.9, INR  1.0, Hgb A1C is 8.4. White blood cell count 7.7, hemoglobin 12.4, hematocrit  37.8, platelet count 290,000.  Liver function studies  are normal.  TSH 1.21.  Preoperative chest x-ray shows no active disease.  CT scan done of the chest  to rule out pulmonary embolism showed no evidence of PE.  There was an  incidental finding of a complicated cyst versus mass lesion in the left  adnexa.  This could represent a tumor of the left ovary.  A transvaginal  pelvic ultrasound was recommended.   DISPOSITION:  The patient is discharged in stable condition.  She will  followup in the office in one week for a pacer site check.  At that time she  could be set up for transvaginal pelvic ultrasound through her primary care  physician or OB/GYN, if she has one.      Abelino Derrick, P.A.                      Molly Maduro  H. Jenne Campus, M.D.    Lenard Lance  D:  06/19/2003  T:  06/20/2003  Job:  [id number]   cc:   Darlin Priestly, M.D.  [address], [address]  [place]  [place] [address]  Fax: [phone]   Venita Lick. Russella Dar, M.D. [facility]

## 2010-08-13 NOTE — Op Note (Signed)
NAMEJANY, BUCKWALTER                           ACCOUNT NO.:  [id number]   MEDICAL RECORD NO.:  [id number]                   PATIENT TYPE:  INP   LOCATION:  NA                                   FACILITY:  [facility]   PHYSICIAN:  Daniel L. Eda Paschal, M.D.           DATE OF BIRTH:  [birth year]   DATE OF PROCEDURE:  08/05/2003  DATE OF DISCHARGE:                                 OPERATIVE REPORT   PREOPERATIVE DIAGNOSIS:  Left ovarian neoplasm.   POSTOPERATIVE DIAGNOSIS:  Mucinous cyst adenoma, benign, of left ovary.   OPERATION:  Exploratory laparotomy with left salpingo-oophorectomy.   SURGEON:  Daniel L. Eda Paschal, M.D.   FIRST ASSISTANT:  Raynald Kemp, M.D.   FINDINGS:  At the time of surgery, the patient had sigmoid colon adherent to  the left adnexa and down in the cul de sac.  Once the colon had been  reflected medially, the ovary and tube on the left side could be seen.  It  was enlarged by an ovarian neoplasm to 4-5 cm.  Once it had been removed,  frozen section revealed a benign mucinous cyst adenoma.  Exploration of the  patient's right adnexal area failed to reveal any remaining ovary and tube.  On ultrasound, there was no evidence of an ovary on the right.   HISTORY:  Patient is status post BSO but obviously had a left ovary.   PROCEDURE:  After adequate general anesthesia, the patient was placed in the  supine position and prepped and draped in the usual sterile manner.  A Foley  catheter was inserted into the patient's bladder.  A midline vertical  incision was made.  The fascia was opened vertically as was the peritoneum.  Subcutaneous bleeders were clamped, and the Bovie disencountered.  When the  peritoneal cavity was opened, the above findings were noted.  Peritoneal  washings were obtained.  The first 30 minutes of the operation consisted  freeing up sigmoid colon, both in the left lateral wall from the bladder  flap and from the vagina.  Once this could be done, the  left adnexa was  finally encountered.  It was enlarged and densely adherent to the broad  ligament.  The peritoneum was entered so that we could get into the  retroperitoneal area.  The iliac vessels were identified.  The ureter was  identified.  The IP ligament was isolated from the ureter, and clamped, cut,  and doubly suture ligated with #1 chromic cat gut.  Using both sharp and  blunt dissection, the entire ovary could be freed up and then was removed  with the fallopian tube and sent to pathology for tissue diagnosis.  There  were several little areas of venous oozing that were controlled with the  Bovie.  Surgicel was put in, and the area was packed while we were waiting  for the frozen section.  Attention was turned to the right adnexa.  Careful  dissection and observation failed to reveal any right adnexa.  The sponge  was removed, and the Surgicel was dry.  Frozen section came back benign.  Please note that previous to doing any of the above dissection, peritoneal  washings had been obtained.  Two sponge, needle, and instrument counts were  correct.  The peritoneum and the fascia were closed with two running 0  Prolene.  A double armed suture was utilized.  Copious  irrigation was done with sterile saline, and then the skin was closed with  staples.  Estimated blood loss of the entire procedure was 200 cc with none  replaced.  The patient tolerated the procedure well and left the operating  room in satisfactory condition.                                               Daniel L. Eda Paschal, M.D.    Tonette Bihari  D:  08/05/2003  T:  08/05/2003  Job:  [id number]

## 2010-08-13 NOTE — Consult Note (Signed)
[name], [name]               ACCOUNT NO.:  [id number]   MEDICAL RECORD NO.:  [id number]          PATIENT TYPE:  INP   LOCATION:  6709                         FACILITY:  [facility]   PHYSICIAN:  Antonietta Breach, M.D.  DATE OF BIRTH:  06-08-1928   DATE OF CONSULTATION:  06/02/2006  DATE OF DISCHARGE:  06/14/2006                                 CONSULTATION   REASON FOR CONSULTATION:  Dementia, agitation, psychosis.   REQUESTING PHYSICIAN:  Betti D. Pecola Leisure, M.D.   HISTORY OF PRESENT ILLNESS:  [name] is a 77 year old female  admitted to the [facility] on June 08, 2006, with weakness and  altered mental status.   The patient began having severe confusion approximately 4 days prior to  admission.  She has had many days of weakness.  The patient has been  having hallucinations along with memory impairment and disorientation.  Her judgment has been impaired.  She has not been combative.  Also, she  is not having depressed mood.   PAST PSYCHIATRIC HISTORY:  The patient has no history of alcohol or  illegal drug use.  She has an unremarkable past medical record when  reviewed for psychiatric symptoms or treatment.  She is not able to  provide adequate history.   FAMILY PSYCHIATRIC HISTORY:  Brother had alcoholism and a sister also  abused alcohol.   SOCIAL HISTORY:  Married.  Occupation;  retired from [employer] account [employer].  Children:  Five.  One has died from breast cancer.   Religion:  [employer].   PAST MEDICAL HISTORY:  1. Hypertension.  2. Non-insulin dependent diabetes mellitus.  3. History of sick sinus syndrome with Medtronic pacemaker.   MEDICATIONS:  The MAR is reviewed.  The patient is not on any  psychotropic medications.   ALLERGIES:  PENICILLIN AND NORVASC.   LABORATORY DATA:  WBC 11.7, hemoglobin 11.7, platelet count 78, SGOT 46,  SGPT 24, albumin 3.2, calcium 9.5.  Head CT without contrast on March 13  showed no acute intracranial abnormality.   REVIEW OF SYSTEMS:  CONSTITUTIONAL:  Afebrile.  No weight loss.  HEAD:  No trauma.  EYES:  No visual changes.  EARS:  No hearing impairment.  NOSE:  No rhinorrhea.  MOUTH/THROAT:  No sore throat.  NEUROLOGIC:  As  above.  PSYCHIATRIC:  As above.  The patient has not been combative  today.  She has had only one short-lived hallucination all day that has  been noticed by people at the bedside.  She does persist with insomnia.  Otherwise, her mood is within normal limits.  She enjoys company.  CARDIOVASCULAR:  No chest pain or palpitations.  RESPIRATORY:  No  coughing or wheezing.  GASTROINTESTINAL:  No nausea, vomiting, diarrhea.  GENITOURINARY:  No dysuria.  SKIN:  Unremarkable.  HEMATOLOGIC/LYMPHATIC:  Unremarkable.  MUSCULOSKELETAL:  No deformities.  ENDOCRINE/METABOLIC:  Unremarkable.   PHYSICAL EXAMINATION:  VITAL SIGNS:  Temperature 99.6, pulse 63,  respiration 20, blood pressure 174/72, O2 saturation on room air 99%.  GENERAL APPEARANCE:  [name] is an elderly female appearing  her chronologic age, sitting up  in her hospital bed in a partially  reclined supine position.  She does not have any abnormal involuntary  movements.  She is well-groomed.  Her eye contact is good.  OTHER MENTAL STATUS EXAM:  She is alert.  Her attention span is mild to  moderately decreased.  Her concentration is moderately decreased.  On  orientation testing, she does not know the year or the month.  She is  oriented to self.  Orientation:  The patient does not know the year or  the month.  She is oriented to self.  Memory testing 3/3 immediate, 0/3  at 3 minutes.  Fund of knowledge and intelligence are below that of her  estimated premorbid baseline.  Her speech involves normal rate and  prosody.  Her volume is soft.  She has no dysarthria.  Thought process  involves partial confabulation.  No looseness of association.  She does  not have intact calculation and abstraction abilities.  Thought  content:  No thoughts of harming herself, no thoughts of harming others, no  delusions, no hallucinations.  Insight is poor.  Judgment is impaired.   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  (293.00)  Delirium not otherwise specified, now essentially resolved.  AXIS II:  None.  AXIS III:  See general medical problems.  AXIS IV:  General medical.  AXIS V:  30.   [name] has critical impairment in memory and judgment.  She does not  have the capacity for informed consent.   RECOMMENDATIONS:  Given that the patient's psychosis is essentially  resolved except for an occasional hallucination, would not expose her to  the risks of an antipsychotic at this time.  On the other hand, would  treat her insomnia with trazodone starting at 25 mg p.o. q.h.s. and 25  mg p.o. q.h.s. p.r.n.   Would then increase the trazodone by 25 mg per day and as needed based  upon the previous night's sleep.      Antonietta Breach, M.D.  Electronically Signed     JW/MEDQ  D:  10/03/2006  T:  10/04/2006  Job:  [id number]

## 2010-08-13 NOTE — Cardiovascular Report (Signed)
[name], [name]                           ACCOUNT NO.:  [id number]   MEDICAL RECORD NO.:  [id number]                   PATIENT TYPE:  INP   LOCATION:  1829                                 FACILITY:  [facility]   PHYSICIAN:  Cristy Hilts. [name], M.D.                  DATE OF BIRTH:  06/26/1928   DATE OF PROCEDURE:  04/17/2003  DATE OF DISCHARGE:  04/17/2003                              CARDIAC CATHETERIZATION   PROCEDURE PERFORMED:  1. Left ventriculography.  2. Selective right and left coronary arteriography.  3. Abdominal aortogram.  4. Right femoral angiography and closure of right femoral arterial access     with Angio-Seal.   INDICATION:  [name] is a 77 year old active African-American female with  history chest pain with four admissions to the emergency room  with  recurrent chest discomfort and has had a Cardiolite stress test that was  done just yesterday which had revealed inferolateral wall ischemia.  Given  her chest pain with radiation to her jaws, she was brought to the cardiac  catheterization lab to evaluate her coronary anatomy.   HEMODYNAMIC DATA:  1. The left ventricular pressure was 140/1 with end-diastolic pressure of 9     mmHg.  2. Aortic pressure 140/56 with a mean of 80 mmHg.  3. There was no pressure gradient across the aortic valve.   ANGIOGRAPHIC DATA:  Left ventricle:  Left ventricular systolic function was  normal and ejection fraction was estimated at 60%.  There is no significant  mitral regurgitation.   Right coronary artery:  The right coronary artery is a very large caliber  vessel and is co-dominant with circumflex coronary artery. It gives origin  to large PDA and PLV branches.  It is normal.   Left main coronary artery:  Left main coronary artery is a large caliber  vessel. It is normal.   Left anterior descending artery:  Left anterior descending artery is a large  caliber vessel.  It gives origin to a very large diagonal 1.  It wraps  around the apex.  It is normal.   Circumflex coronary artery:  Circumflex coronary artery is a large caliber  vessel.  It gives origin to large obtuse marginal one and the distal  circumflex gives origin to several small PDA branches.  It is normal.   ABDOMINAL AORTOGRAM:  Abdominal aortogram revealed no evidence of abdominal  aortic aneurysm.  There were two renal arteries; one on either side.  They  are widely patent.  There is mild tortuosity of the abdominal aorta with  mild scoliosis noted of the spine.   IMPRESSION:  1. Normal left ventricular systolic function with ejection fraction of 60%.     No significant mitral regurgitation.  2. Normal coronary arteries, circumflex and right co-dominant system.   RECOMMENDATIONS:  Evaluation for noncardiac cause of chest pain is  indicated.  The patient also has significant  reflux and hiatal hernia.  Hence, the chest pain could potentially be from her GI source.   TECHNIQUE OF PROCEDURE:  Under usual sterile precautions, using a 5 French  right femoral venous access, a 6 French right femoral arterial access, a  left heart catheterization was performed through the arterial access sheath.  A 6 French multipurpose pigtail catheter was advanced into the ascending  aorta over a 0.035-inch J wire.  The catheter was then gently advanced to  the left ventricle and left ventricular pressures were monitored.  Hand  contrast injection of the left ventricle was performed both in the LAO and  RAO projection.  The catheter was flushed with saline and pulled back into  the ascending aorta and pressure gradient across the aortic valve was  monitored.  The catheter was utilized to engage the RCA and angiography was  performed.  In a similar fashion, left main coronary artery was selectively  engaged and angiography was performed.  Then, the catheter was pulled back  in the abdominal aorta and abdominal aortogram was performed.  Then,  catheter was pulled  out of the body in usual fashion.  Right femoral  angiography was performed through an arterial access sheath and the access  was closed with Angio-Seal with excellent hemostasis obtained and the  patient was transferred to recovery in a stable condition.  The patient  tolerated the procedure well.  No complications.                                               Cristy Hilts. [name], M.D.    Pilar Plate  D:  04/17/2003  T:  04/18/2003  Job:  [id number]

## 2010-08-13 NOTE — Op Note (Signed)
NAMELUCIE, [name]                           ACCOUNT NO.:  [id number]   MEDICAL RECORD NO.:  [id number]                   PATIENT TYPE:  OIB   LOCATION:  NA                                   FACILITY:  [facility]   PHYSICIAN:  Darlin Priestly, M.D.             DATE OF BIRTH:  March 05, 1929   DATE OF PROCEDURE:  06/18/2003  DATE OF DISCHARGE:                                 OPERATIVE REPORT   OPERATION PERFORMED:  Insertion of a Medtronic Enpulse generator with  passive atrial and ventricular leads.   CARDIOLOGIST:  Darlin Priestly, M.D.   COMPLICATIONS:  None.   INDICATIONS:  Ms. Burstein is a 77 year old female, patient of Dr. Lavonne Chick,  with a history of symptomatic bradycardia, hypertension, history of  intermittent chest pain with normal coronaries.  She was admitted on June 18, 2003 with increasing fatigue and presyncope.  She is now referred for  dual chamber pacemaker implant.   DESCRIPTION OF OPERATION:  After obtaining informed written consent the  patient was brought to the cardiac cath lab in a fasting state.  The left  anterior chest was then prepped and draped in a sterile fashion.  One  percent lidocaine was then used to anesthetize the left infraclavicular  area.  Approximately a 3 cm horizontal incision was then performed  approximately 2 cm beneath the left clavicle.  Hemostasis was obtained with  electrocautery.  Blunt dissection was then used to carry this down to the  left pectoral fascia.  Approximately a 3 x 4 cm pocket was then created over  the left pectoral fascia, and again hemostasis was obtained with  electrocautery.  Next, the left subclavian vein was then entered using  fluoroscopic guidance and a guidewire was then easily passed into the SVC  and right atrium.  Next, a #9 [place] dilator and sheath were then easily  passed over the guidewire, and the guidewire and dilator were then removed.  Following this a Medtronic 52 cm passive lead, model number  [id number], serial  number [id number] was then easily passed into the right atrium, and the  guidewire was then retained.  Peel-away sheaths were removed.  A _________  [place] dilator and sheath were then passed over the retained guidewire and  the guidewire and dilator were then removed.  A second 45-cm passive  Medtronic lead, model number 4592, serial number [id number] was then easily  passed through the right atrium, and guidewire was retained.  Peel-away  sheaths were then removed.  The guidewire was then anchored to the sheath  with a hemostat.  Next, a J-curve was then placed on the ventricular lead  stylet and the ventricular lead was then allowed to prolapse to the  tricuspid valve, and positioned in the RV apex.   Thresholds were then determined.  R waves were measured at 16 mV.  Threshold  in the ventricular was 0.3 volts at  0.5 msec.  Current was 0.8 mA.  Impedance was 617 ohms.  The atrial  lead was then positioned in the right  atrial appendage, and thresholds were determined.  P waves were measured at  3.7 mV.  Threshold in the atrium was 0.6 volts at 0.5 msec.  Current was 1.8  mA.  Impedance was 465 ohms.   These leads were then sutured place with 2-0 silk sutures per lead.  The  pocket was then copiously irrigated with 1% kanamycin solution.  Again,  hemostasis was confirmed.  The pacing leads were connected in a serial  fashion to a Medtronic Enpulse [id number], serial [id number] H generator, and head  screws were tightened, and pacing was confirmed.  A single silk suture was  then placed at the apex of the pocket, and the leads were then delivered  into the pocket, and the generator was then secured into place.  The pocket  was then closed using running 2-0 Dexon for the subcutaneous layer and  running 5-0 Dexon for the skin layer.  Steri-strips were then applied.   The patient was transferred in stable condition.   CONCLUSION:  Successful placement of a Medtronic Enpulse  [id number], serial  [id number] H generator, with passive atrial and ventricular leads.                                               Darlin Priestly, M.D.    RHM/MEDQ  D:  06/18/2003  T:  06/20/2003  Job:  [id number]   cc:   Madaline Savage, M.D.  [address]  [place]  [place] [address]  Fax: [phone]

## 2010-08-13 NOTE — Discharge Summary (Signed)
[name], [name]               ACCOUNT NO.:  [id number]   MEDICAL RECORD NO.:  [id number]          PATIENT TYPE:  INP   LOCATION:  2027                         FACILITY:  [facility]   PHYSICIAN:  Madaline Savage, M.D.DATE OF BIRTH:  1928/05/31   DATE OF ADMISSION:  05/15/2004  DATE OF DISCHARGE:  05/16/2004                                 DISCHARGE SUMMARY   DISCHARGE DIAGNOSIS:  1.  Fatigue, unclear etiology.  2.  Hypertension, controlled.  3.  Non-insulin dependent diabetes.  4.  Normal left ventricular function and normal coronaries by      catheterization January 2005.  5.  History of sick sinus syndrome status post Medtronic pacemaker in the      past.   HOSPITAL COURSE:  The patient is 77 year old female followed by Dr. Elsie Lincoln  and Dr. Pecola Leisure with  history as noted above. She did have a catheterization  in January 2005 after an abnormal Cardiolite study which revealed normal  coronaries and normal LV function. She has had a  Medtronic pacemaker  implanted for sick sinus syndrome and bradycardia. She was admitted through  the emergency room May 15, 2004, after an episode of fatigue. She felt  like this was excessive for her. In the emergency room she was seen by Dr.  Domingo Sep. There were no acute lab abnormalities. She did have a few  leukocytes but no nitrates in her urine and a urine culture was sent and  results are pending. She has not been febrile. She was monitored overnight  without arrhythmias. Pacemaker was checked by the Medtronic rep on May 16, 2004,  and is functioning normally. We feel she can be discharged today  May 16, 2004. She will resume her home medications which include ACE on  8 mg a day, glipizide 10 mg a day, Actos, Nexium, Celebrex 20 mg a day, HCTZ  25 mg a day and Xalatan and timolol eye drops. She will follow up with Dr.  Elsie Lincoln.   LABS:  White count 8.4, hemoglobin 11.2, hematocrit 33.8, platelets 208.  Sodium 139, potassium 3.5,  BUN 17, creatinine 0.8 CK was 248 with an  negative MB and negative troponin. TSH is pending. H. Pylori is pending. D-  dimer was 0.52    Chest x-ray  shows no evidence of acute disease with left subclavian dual  lead pacemaker intact.   Urinalysis did show a moderate amount leukocyte esterase with no nitrates.   PLAN:  The patient is to be discharged later today. I will have her follow-  up about in about a week in the office. She will need a follow-up over TSH,  H. Pylori and urine culture.      LKK/MEDQ  D:  05/16/2004  T:  05/17/2004  Job:  [id number]

## 2010-08-13 NOTE — Procedures (Signed)
EEG NUMBER:  [id number]   EEG ORDERED BY:  Betti D. Pecola Leisure, M.D.   This is a routine 17-channel EEG performed utilizing the International  10/20 lead placement system with 1 channel devoted to EKG.  The patient  is clinically described as being awake and asleep.  This is a 77-year-  old woman with a history of dementia and diabetes who was admitted with  confusion, falling, and facial drooping.   Electrographically, the patient appears to be asleep for the majority of  the recording, although there are some periods of intermittent  wakefulness. While awake, the background consists of a fairly well-  organized, well-modulated, well-developed 9 Hz alpha activity. which is  predominant in the posterior head regions and is reactive to eye  opening.  There is some theta activity seen initially which may have  been drowsiness.  The patient spent the majority of the record in sleep  with fairly typical sleep spindles and occasional central sharp activity  and K complexes. During transition between awake and sleep and sometimes  at other occasions during wakefulness, small sharp waves are seen in the  left temporal the left frontal regions and left temporal central  regions.  No clinical activity is identified associated with this. Some  bifrontal slowing is seen, but again usually with the transition between  wakefulness and sleep.  No definite epileptiform discharges are seen  neck.  Photic stimulation was performed but did not produce significant  change in the background activity.  Hyperventilation was not performed.  The EKG monitor reveals relatively regular rhythm with a rate of 60  beats per minute with occasional ectopy.   CONCLUSION:  Abnormal EEG with the patient in the waking and sleep  states, showing occasional left frontal temporal sharp wave activity  which may be indicative of an underlying structural abnormality.  This  could serve as an irritative focus for seizure activity,  although no  definite seizure discharges were seen, and no clinical seizure activity  was recorded.  Clinical correlation is recommended.      Catherine A. Orlin Hilding, M.D.  Electronically Signed     OZD:GUYQ  D:  06/12/2006 11:32:18  T:  06/12/2006 15:46:35  Job #:  [id number]

## 2010-08-13 NOTE — Discharge Summary (Signed)
NAMEMAHARI, [name]                           ACCOUNT NO.:  [id number]   MEDICAL RECORD NO.:  [id number]                   PATIENT TYPE:  INP   LOCATION:  0479                                 [facility]ACILITY:  [facility]   PHYSICIAN:  Daniel L. Eda Paschal, M.D.           DATE OF BIRTH:  [birth year]   DATE OF ADMISSION:  08/05/2003  DATE OF DISCHARGE:  08/07/2003                                 DISCHARGE SUMMARY   HISTORY OF PRESENT ILLNESS:  The patient is a 77 year old female who had  presented to the office with pelvic pain and a CT finding of a left adnexal  mass that was felt to be suspicious for an ovarian malignancy.  Ultrasound  in our office showed a complex mass of the left ovary, which also was  suspicious for ovarian malignancy.  As a result of the above, she was taken  to the operating room, and exploratory laparotomy was done.  The patient had  dense pelvic adhesive disease from her previous surgery.  Once all the  adhesions of the colon were dissected free from the adnexa, she had a left  ovarian neoplasm, which was removed.  Frozen section revealed a benign  mucinous cyst adenoma.  On final pathology report, it actually turned out to  be a mucinous cyst adenofibroma.  It was 6 cm in size.  Postoperatively, she  progressed rapidly and uneventfully, and by the second postoperative day was  voiding, passing gas, and was ready for discharge.   DISCHARGE MEDICATIONS:  Vicodin p.r.n. for pain.   WOUND CARE:  Routine.   FOLLOW UP:  She will return to the office in 5 days for staple removal.   DIET:  Soft to advance as tolerated.   ACTIVITY:  Regular.   CONDITION ON DISCHARGE:  Improved.   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   OPERATION:  Exploratory laparotomy with lysis of adhesions and left salpingo-  oophorectomy.                                               Daniel L. Eda Paschal, M.D.    Tonette Bihari  D:  08/07/2003  T:  08/07/2003  Job:   [id number]   cc:   Barbette Hair. Arlyce Dice, M.D. [facility]

## 2010-08-19 ENCOUNTER — Encounter: Payer: Self-pay | Admitting: Pharmacist

## 2010-11-03 ENCOUNTER — Non-Acute Institutional Stay: Payer: Self-pay | Admitting: Family Medicine

## 2010-11-04 ENCOUNTER — Telehealth: Payer: Self-pay | Admitting: Family Medicine

## 2010-11-04 NOTE — Telephone Encounter (Signed)
Received call from NP Thomasene Mohair)  w/ [facility] about pt with syncopal/near syncopal episode at [facility]. NP saw pt, back at baseline. This has been a recurrent issue for the pt. NP also spoke with family. They are  declining any lab work or evaluation. They are agreeable to watchful waiting. NP just wanted to make on call MD aware of event.

## 2010-11-12 ENCOUNTER — Encounter: Payer: Self-pay | Admitting: Pharmacist

## 2011-01-19 ENCOUNTER — Non-Acute Institutional Stay: Payer: Self-pay | Admitting: Family Medicine

## 2011-01-19 NOTE — Progress Notes (Unsigned)
  Subjective:    [name], female    DOB: [birth year], 77 y.o.   MRN: [id number]  HPI no problems reported by nursing. Her weight has increased    Review of Systems     Objective:   Physical Examshe remains mid midmorning.  Lying on her side with contracted extremities had tell in each hand.  Chest clear Heart regular rhythm without murmur Abdomen soft without masses or tenderness Ankles no edema Neurologic lead pipe rigidity of her arms, positive Myerson sign, no tremor. No eye contact or verbal response        Assessment & Plan:

## 2011-02-04 ENCOUNTER — Encounter: Payer: Self-pay | Admitting: Family Medicine

## 2011-02-11 ENCOUNTER — Encounter: Payer: Self-pay | Admitting: Family Medicine

## 2011-02-11 DIAGNOSIS — H409 Unspecified glaucoma: Secondary | ICD-10-CM | POA: Insufficient documentation

## 2011-02-11 DIAGNOSIS — I10 Essential (primary) hypertension: Secondary | ICD-10-CM | POA: Insufficient documentation

## 2011-02-11 DIAGNOSIS — N189 Chronic kidney disease, unspecified: Secondary | ICD-10-CM | POA: Insufficient documentation

## 2011-02-11 DIAGNOSIS — D509 Iron deficiency anemia, unspecified: Secondary | ICD-10-CM | POA: Insufficient documentation

## 2011-03-02 ENCOUNTER — Non-Acute Institutional Stay: Payer: Self-pay | Admitting: Family Medicine

## 2011-03-02 ENCOUNTER — Encounter: Payer: Self-pay | Admitting: Family Medicine

## 2011-03-02 NOTE — Progress Notes (Unsigned)
  Subjective:    [name], female    DOB: [birth year], 77 y.o.   MRN: [id number]  HPI no recent problems reported by staff was seen by her of her care nurse practitioner on November 13 who noted a plan to discuss goals of care further with the family.     Review of Systems     Objective:   Physical Exam she was semireclined in the geriatric chair. Her eyes are closed shut tightly. She did open him briefly when I moved her legs. Heart regular rhythm  chest clear  abdomen is soft lower midline surgical scar which is old.  No ankle edema Shim it is upper extremities are tightly contracted. Legs lack 20 of extension of both knees        Assessment & Plan:

## 2011-03-28 ENCOUNTER — Non-Acute Institutional Stay: Payer: Self-pay | Admitting: Family Medicine

## 2011-03-28 ENCOUNTER — Encounter: Payer: Self-pay | Admitting: Family Medicine

## 2011-03-28 DIAGNOSIS — G20 Parkinson's disease: Secondary | ICD-10-CM

## 2011-03-28 NOTE — Progress Notes (Signed)
Subjective:     [name], female   DOB: 02/18/1929, 77 y.o.   MRN: [id number]  HPI Nonverbal nursing home resident. No acute events, complaints or concerns from patient's RN or daughter. Patient's RN states that her appetite is good. Patient had small sacral decubitus ulcer that has healed out. Patient was home from Christmas.   Review of Systems Unobtainable.     Objective:   Physical Exam Filed Vitals:   02/01/11 1751 03/28/11 2232  BP:  137/72  Pulse:  60  Weight: 147 lb 9.6 oz  153 lb 6 oz   Wt Readings from Last 3 Encounters:  02/01/11 147 lb 9.6 oz (66.951 kg)  07/15/10 148 lb 9.6 oz (67.405 kg)  06/25/10 134 lb (60.782 kg)  General appearance: awake, no distress. Sitting in wheelchair. Upper extremity and hand contractures.  Lungs: clear to auscultation bilaterally Heart: regular rate and rhythm, S1, S2 normal, no murmur, click, rub or gallop Abdomen: soft, non-tender; bowel sounds normal; no masses,  no organomegaly Extremities: contractures and rigidity bilateral upper extremities. Wash clothes in hands. No lower extremity edema.  Skin: Skin color, texture, turgor normal. No rashes or lesions Neuro: 3+ reflexes. Rigidity, pill rolling and lip smacking.       Assessment:     77 yo F nursing home patient. Stable medical condition. Daughter updated about status and voiced no concerns regarding care.     Plan:     Continue current medical management including restorative care to maintain upper extremity range of motion. Continue tylenol as needed for pain.

## 2011-03-28 NOTE — Assessment & Plan Note (Signed)
Late stage. Bed to [facility] chair activity. Being fed. Gaining weight.

## 2011-04-13 ENCOUNTER — Encounter: Payer: Self-pay | Admitting: Family Medicine

## 2011-04-13 ENCOUNTER — Non-Acute Institutional Stay: Payer: Self-pay | Admitting: Family Medicine

## 2011-04-13 DIAGNOSIS — G20 Parkinson's disease: Secondary | ICD-10-CM

## 2011-04-13 DIAGNOSIS — I10 Essential (primary) hypertension: Secondary | ICD-10-CM

## 2011-04-13 NOTE — Assessment & Plan Note (Signed)
Blood pressure is well-controlled without pharmacotherapy.   

## 2011-04-13 NOTE — Assessment & Plan Note (Signed)
Late stage. Bed to Geri chair activity. Being fed. Gaining weight.    

## 2011-04-13 NOTE — Progress Notes (Signed)
[name], female   DOB: 1928/04/12, 78 y.o.   MRN: [id number] Subjective:     [name], female   DOB: 03/24/1929, 78 y.o.   MRN: [id number]  HPI Nonverbal nursing home resident. No acute events since last visit. I reviewed her chart including new orders.    Review of Systems Unobtainable.     Objective:   Physical Exam  Wt Readings from Last 3 Encounters:  04/13/11 157 lb 8 oz (71.442 kg)  02/01/11 147 lb 9.6 oz (66.951 kg)  07/15/10 148 lb 9.6 oz (67.405 kg)  General appearance: Asleep in bed. Able to be aroused. Non verbal. Fluttered eye lids but did not open eyes to exam.  Lungs: clear to auscultation bilaterally Heart: regular rate and rhythm, S1, S2 normal, no murmur, click, rub or gallop Abdomen: soft, non-tender; bowel sounds normal; no masses,  no organomegaly Extremities: contractures and rigidity bilateral upper extremities. Carrots in  Both hands. No lower extremity edema.  Skin: Skin color, texture, turgor normal. No rashes or lesions Neuro: Increased tone.        Assessment:     78 yo F nursing home patient with advanced Parkinson's disease and Alzheimer's dementia. Stable medical condition with continued weight gain.    Plan:     Continue current medical management including restorative care to maintain upper extremity range of motion. Continue tylenol as needed for pain. Agree with decreasing nutrition supplement from twice daily to once daily in light of patient's continued weight gain.

## 2011-04-20 ENCOUNTER — Encounter: Payer: Self-pay | Admitting: Family Medicine

## 2011-04-26 ENCOUNTER — Encounter: Payer: Self-pay | Admitting: Family Medicine

## 2011-04-26 LAB — TSH
Blood: 2.205
Hgb A1c MFr Bld: 6.6 % — AB (ref 4.0–6.0)

## 2011-04-27 ENCOUNTER — Non-Acute Institutional Stay: Payer: Self-pay | Admitting: Family Medicine

## 2011-05-04 ENCOUNTER — Encounter: Payer: Self-pay | Admitting: Pharmacist

## 2011-06-01 ENCOUNTER — Non-Acute Institutional Stay: Payer: Self-pay | Admitting: Family Medicine

## 2011-06-01 DIAGNOSIS — E663 Overweight: Secondary | ICD-10-CM

## 2011-06-06 ENCOUNTER — Other Ambulatory Visit: Payer: Self-pay | Admitting: Family Medicine

## 2011-06-09 ENCOUNTER — Non-Acute Institutional Stay: Payer: Self-pay | Admitting: Family Medicine

## 2011-06-09 DIAGNOSIS — I10 Essential (primary) hypertension: Secondary | ICD-10-CM

## 2011-06-09 DIAGNOSIS — R269 Unspecified abnormalities of gait and mobility: Secondary | ICD-10-CM

## 2011-06-09 DIAGNOSIS — E1129 Type 2 diabetes mellitus with other diabetic kidney complication: Secondary | ICD-10-CM

## 2011-06-09 DIAGNOSIS — E119 Type 2 diabetes mellitus without complications: Secondary | ICD-10-CM

## 2011-06-09 NOTE — Assessment & Plan Note (Signed)
Blood pressure is well-controlled without pharmacotherapy.

## 2011-06-09 NOTE — Assessment & Plan Note (Signed)
A: Declined. A1c elevated at 6.6. Nutritional supplements discontinued.  P: F/u repeat A1c in 2 months.  Deferred diabetic foot exam as patient is wheel chair bound.  

## 2011-06-09 NOTE — Assessment & Plan Note (Signed)
Geri chair bound due to rigidity. Developed gluteal abrasion. Continue care per wound care team.

## 2011-06-09 NOTE — Assessment & Plan Note (Addendum)
A: Declined. A1c elevated at 6.6. Nutritional supplements discontinued.  P: F/u repeat A1c in 2 months.  Deferred diabetic foot exam as patient is wheel chair bound.

## 2011-06-09 NOTE — Progress Notes (Signed)
  Subjective:     [name], female   DOB: [birth year], 78 y.o.   MRN: [id number]  HPI Nonverbal nursing home resident. Developed R gluteal abrasion since last visit which is being followed by wound care. I reviewed her chart including new orders.    Review of Systems Unobtainable.     Objective:   Physical Exam  Wt Readings from Last 3 Encounters:  06/09/11 162 lb 3.2 oz (73.573 kg)  04/13/11 157 lb 8 oz (71.442 kg)  02/01/11 147 lb 9.6 oz (66.951 kg)  General appearance: Awake in Geri chair. Non verbal. Opened eyes to exam.  Lungs: clear to auscultation bilaterally Heart: regular rate and rhythm, S1, S2 normal, no murmur, click, rub or gallop Abdomen: soft, non-tender; bowel sounds normal; no masses,  no organomegaly Extremities: contractures and rigidity bilateral upper extremities. Carrots in  Both hands. No lower extremity edema.  Skin: Skin color, texture, turgor normal. No rashes or lesions Neuro: Increased tone.  Pill rolling and cog wheel rigidity.       Assessment:     78 yo F nursing home patient with advanced Parkinson's disease and Alzheimer's dementia. Stable medical condition with continued weight gain. Gluteal abrasion being followed by wound care and treated with hydrogel.     Plan:     Continue current medical management including restorative care to maintain upper extremity range of motion. Continue tylenol as needed for pain. Agree with discontinuing nutritional supplements given steady weight gain.

## 2011-06-30 LAB — HEMOGLOBIN A1C
A1c: 6.7
Glucose: 146

## 2011-07-01 ENCOUNTER — Telehealth: Payer: Self-pay | Admitting: Family Medicine

## 2011-07-01 NOTE — Telephone Encounter (Signed)
Labs entered.

## 2011-08-05 ENCOUNTER — Non-Acute Institutional Stay: Payer: Self-pay | Admitting: Family Medicine

## 2011-08-05 DIAGNOSIS — E1129 Type 2 diabetes mellitus with other diabetic kidney complication: Secondary | ICD-10-CM

## 2011-08-05 DIAGNOSIS — I10 Essential (primary) hypertension: Secondary | ICD-10-CM

## 2011-08-10 ENCOUNTER — Encounter: Payer: Self-pay | Admitting: Family Medicine

## 2011-08-10 NOTE — Assessment & Plan Note (Signed)
Agree with plan to monitor BP BID x 1 week and to obtains TSH, CMP and A1c on next blood draw. If BP consistently elevated > 150/90 plan to start low dose HCTZ 12.5 mg daily.

## 2011-08-10 NOTE — Progress Notes (Signed)
[name], female   DOB: [birth year], 78 y.o.   MRN: [id number]  Subjective:    HPI Nonverbal nursing home resident. Since last office visits she had a stage 2 R gluteal ulcer that is healing. She has had some elevated BP. No falls. Continues to work with PT to maintain range of motion.     Review of Systems Unobtainable.     Objective:   Physical Exam Filed Vitals:   07/30/11 1513  BP: 178/78  Pulse: 70  Temp: 97.1 F (36.2 C)  Resp: 19   BP Readings from Last 3 Encounters:  07/30/11 178/78  06/09/11 114/61  04/13/11 124/76   Wt Readings from Last 3 Encounters:  07/30/11 162 lb 6.4 oz (73.664 kg)  06/09/11 162 lb 3.2 oz (73.573 kg)  04/13/11 157 lb 8 oz (71.442 kg)  General appearance: Awake in bed. Non verbal. Opened eyes to exam.  Lungs: clear to auscultation bilaterally Heart: regular rate and rhythm, S1, S2 normal, no murmur, click, rub or gallop Abdomen: soft, non-tender; bowel sounds normal; no masses,  no organomegaly Extremities: contractures and rigidity bilateral upper extremities. Carrots in  Both hands. No lower extremity edema.  Skin: Superficial abrasion on R gluteal/hip. No ulcerations noted.  Neuro: Increased tone.  Pill rolling and cog wheel rigidity.       Assessment:     78 yo F nursing home patient with advanced Parkinson's disease and Alzheimer's dementia. Stable medical condition with stable weight.  Gluteal abrasion being dressed and improving. Hypertensive on last two checks.    Plan:     Continue current medical management including restorative care to maintain upper extremity range of motion. Continue tylenol as needed for pain. Agree with plan to monitor BP BID x 1 week and to obtains TSH, CMP and A1c on next blood draw. If BP consistently elevated > 150/90 plan to start low dose HCTZ 12.5 mg daily.

## 2011-08-10 NOTE — Assessment & Plan Note (Signed)
Diet controlled. A1c at next blood draw.

## 2011-08-24 ENCOUNTER — Non-Acute Institutional Stay: Payer: Self-pay | Admitting: Family Medicine

## 2011-08-24 DIAGNOSIS — E2749 Other adrenocortical insufficiency: Secondary | ICD-10-CM

## 2011-08-24 DIAGNOSIS — E1129 Type 2 diabetes mellitus with other diabetic kidney complication: Secondary | ICD-10-CM

## 2011-08-24 DIAGNOSIS — D649 Anemia, unspecified: Secondary | ICD-10-CM

## 2011-08-24 NOTE — Progress Notes (Signed)
  Subjective:    [name], female    DOB: [birth year], 78 y.o.   MRN: [id number]  HPI No changes reported by the staff. The DON, her grand daughter [place], did not have any concerns.   Her recent TSH, CMP, and A1c were unremarkable Review of Systems     Objective:   Physical Exam  Constitutional:       Lying in bed. Will open eyes momentarily, but no verbal response.   Cardiovascular: Normal rate and regular rhythm.   Pulmonary/Chest: Effort normal and breath sounds normal.  Abdominal: Soft.  Musculoskeletal:       Flexion contractures of the hands which are holding soft "carrot shaped" pads          Assessment & Plan:

## 2011-08-26 ENCOUNTER — Encounter: Payer: Self-pay | Admitting: Family Medicine

## 2011-08-26 NOTE — Assessment & Plan Note (Signed)
Several recent A1c have been under 7.0. I believe we can just check this every 6 months.

## 2011-08-26 NOTE — Assessment & Plan Note (Signed)
Her most recent hemoglobin was 10.4 with a decreased MCV to 70.9 I ordered CBC, ferritin, serum iron and TIBC to see if she would benefit from iron.  GI studies would be high risk in her situation

## 2011-08-26 NOTE — Telephone Encounter (Signed)
error 

## 2011-08-26 NOTE — Assessment & Plan Note (Signed)
Resolved and some recent blood pressures have been high. Will continue to follow since last blood pressure was 120/83

## 2011-09-01 LAB — CBC
Ferritin: 35 ng/mL (ref 9.0–150.0)
HCT: 33 %
MCV: 71.2 fL — AB (ref 78–100)
WBC: 7.2

## 2011-09-08 ENCOUNTER — Other Ambulatory Visit: Payer: Self-pay | Admitting: Family Medicine

## 2011-09-08 MED ORDER — FERROUS SULFATE 325 (65 FE) MG PO TABS: 325.0000 mg | ORAL_TABLET | Freq: Every day | ORAL | Status: DC

## 2011-09-08 MED ORDER — DOCUSATE SODIUM 100 MG PO CAPS: 100.0000 mg | ORAL_CAPSULE | Freq: Two times a day (BID) | ORAL | Status: AC

## 2011-09-12 ENCOUNTER — Encounter: Payer: Self-pay | Admitting: Family Medicine

## 2011-09-12 DIAGNOSIS — D509 Iron deficiency anemia, unspecified: Secondary | ICD-10-CM

## 2011-09-12 NOTE — Assessment & Plan Note (Signed)
A: Microcytic iron deficiency anemia with a total iron < 10.  P:  Supplement iron orally.

## 2011-10-12 ENCOUNTER — Non-Acute Institutional Stay: Payer: Self-pay | Admitting: Family Medicine

## 2011-10-12 DIAGNOSIS — I10 Essential (primary) hypertension: Secondary | ICD-10-CM

## 2011-10-12 DIAGNOSIS — F0280 Dementia in other diseases classified elsewhere without behavioral disturbance: Secondary | ICD-10-CM

## 2011-10-12 DIAGNOSIS — E1129 Type 2 diabetes mellitus with other diabetic kidney complication: Secondary | ICD-10-CM

## 2011-10-12 NOTE — Progress Notes (Signed)
  Subjective:    [name], female    DOB: [birth year], 78 y.o.   MRN: [id number]  HPI  Patient seen reclining in TV room.  Nonverbal.  Appears comfortable.  Nursing states, she continues to eat well, no significant change in function.  Review of Systems See HPI      Objective:   Physical Exam GEN: Sleeping in recliner chair with feel elevated.  Arouses to firm touch.  Nonverbal, does not follow commands. CV:  Regular Rate & Rhythm, no murmur Respiratory:  Normal work of breathing, CTAB Abd:  + BS, soft, no tenderness to palpation Ext: no pre-tibial edema, legs elevated wit compression stockings.  Arms contracted, spasticity. Hands grasping blue carrot hand orthosis bilaterally       Assessment & Plan:

## 2011-10-12 NOTE — Assessment & Plan Note (Signed)
Continues to be minimally interactive.

## 2011-10-12 NOTE — Assessment & Plan Note (Signed)
bp and hr stable

## 2011-10-12 NOTE — Assessment & Plan Note (Signed)
a1c pending.  I dont anticipate changes.  6.7% in april

## 2011-10-20 LAB — CBC & DIFF AND RETIC
Hemoglobin: 11.1 g/dL — AB (ref 12.0–16.0)
Iron: <10
Platelets: 183
WBC: 6.7

## 2011-10-21 ENCOUNTER — Non-Acute Institutional Stay: Payer: Self-pay | Admitting: Family Medicine

## 2011-10-21 DIAGNOSIS — I10 Essential (primary) hypertension: Secondary | ICD-10-CM

## 2011-10-21 NOTE — Assessment & Plan Note (Signed)
BP at goal 

## 2011-10-21 NOTE — Progress Notes (Signed)
[name], female   DOB: 1929-03-08, 78 y.o.   MRN: [id number]  Subjective:    HPI Nonverbal nursing home resident. Since last office visits she was started on iron therapy for iron deficiency anemia and she had some elevated BPs.. No falls. Continues to work with PT to maintain range of motion.    Review of Systems Unobtainable.     Objective:   Physical Exam There were no vitals filed for this visit. BP Readings from Last 3 Encounters:  10/08/11 133/73  08/06/11 120/83  07/30/11 178/78   Wt Readings from Last 3 Encounters:  09/11/11 165 lb 9.6 oz (75.116 kg)  07/30/11 162 lb 6.4 oz (73.664 kg)  06/09/11 162 lb 3.2 oz (73.573 kg)  General appearance: Asleep in chair. Non verbal. Opened eyes to exam. Followed me with her eyes.  Lungs: clear to auscultation bilaterally Heart: regular rate and rhythm, S1, S2 normal, no murmur, click, rub or gallop Abdomen: soft, non-tender; bowel sounds normal; no masses,  no organomegaly Extremities: contractures and rigidity bilateral upper extremities.  No lower extremity edema.  Skin: No ulcerations noted.  Neuro: Increased tone.  Pill rolling and cog wheel rigidity and lip smacking.   Current Outpatient Prescriptions on File Prior to Visit  Medication Sig Dispense Refill  . acetaminophen (TYLENOL) 650 MG CR tablet Take 1 tablet (650 mg total) by mouth 2 (two) times daily.      Marland Kitchen aspirin (ADULT ASPIRIN EC LOW STRENGTH) 81 MG EC tablet Take 1 tablet (81 mg total) by mouth daily.      . carbidopa-levodopa (SINEMET) 25-250 MG per tablet Take 1 tablet by mouth 3 (three) times daily. Take prior to meals      . ergocalciferol (VITAMIN D2) 50000 UNIT capsule Take 50,000 Units by mouth every 30 (thirty) days. Given on 15th of month.      . ferrous sulfate 325 (65 FE) MG tablet Take 1 tablet (325 mg total) by mouth daily with breakfast.  30 tablet  11  . latanoprost (XALATAN) 0.005 % ophthalmic solution Place 1 drop into both eyes daily.       . Multiple Vitamin (MULTIVITAMIN) tablet Take 1 tablet by mouth daily.        . polyethylene glycol (MIRALAX) powder Take 17 g by mouth daily.        . timolol (TIMOPTIC-XE) 0.5 % ophthalmic gel-forming Place 1 drop into both eyes daily.       Assessment:     78 yo F nursing home patient with advanced Parkinson's disease and Alzheimer's dementia. Stable medical condition with stable weight. BP at goal.  Plan:     Continue current medical management including restorative care to maintain upper extremity range of motion. Continue current management.

## 2011-10-24 ENCOUNTER — Encounter: Payer: Self-pay | Admitting: Family Medicine

## 2011-10-24 DIAGNOSIS — D509 Iron deficiency anemia, unspecified: Secondary | ICD-10-CM

## 2011-10-24 NOTE — Assessment & Plan Note (Signed)
A: slight improvement. I ? Whether or not the patient is adequately absorbing the iron.  P: continue oral iron therapy. Recommend iron with vit C (3 oz of OJ is sufficient).

## 2011-12-16 ENCOUNTER — Encounter: Payer: Self-pay | Admitting: Pharmacist

## 2011-12-19 ENCOUNTER — Non-Acute Institutional Stay: Payer: Self-pay | Admitting: Family Medicine

## 2011-12-19 DIAGNOSIS — E1129 Type 2 diabetes mellitus with other diabetic kidney complication: Secondary | ICD-10-CM

## 2011-12-19 DIAGNOSIS — D509 Iron deficiency anemia, unspecified: Secondary | ICD-10-CM

## 2011-12-19 DIAGNOSIS — I10 Essential (primary) hypertension: Secondary | ICD-10-CM

## 2011-12-19 DIAGNOSIS — G20 Parkinson's disease: Secondary | ICD-10-CM

## 2011-12-19 NOTE — Assessment & Plan Note (Signed)
A1c 6.4 on 10/07/11

## 2011-12-19 NOTE — Assessment & Plan Note (Signed)
well controlled  

## 2011-12-19 NOTE — Assessment & Plan Note (Signed)
Source of iron deficiency undetermined.

## 2011-12-19 NOTE — Progress Notes (Signed)
  Subjective:    [name], female    DOB: February 13, 1929, 78 y.o.   MRN: [id number]  HPI Her granddaughter, Seward Meth, the DON, says that she continues eating well despite little interaction with those around her   Review of Systems     Objective:   Physical Exam  Constitutional: She appears well-developed and well-nourished.  Cardiovascular: Normal rate and regular rhythm.   No murmur heard. Musculoskeletal:       Contractures of extremities  Neurological:       Opened eyes to voice, but no eye contact, no vocalization  Generalized rigidity          Assessment & Plan:

## 2011-12-19 NOTE — Assessment & Plan Note (Signed)
Functionally quadriplegic, but no dysphagia yet

## 2011-12-29 LAB — CBC
Folate: 10.6
Iron: 57
TIBC: 313
Vitamin B-12: 517

## 2012-01-02 LAB — BASIC METABOLIC PANEL
BUN: 20 mg/dL (ref 4–21)
Calcium: 9.2 mg/dL
GFR: 57
Glucose: 77
Vitamin B-12: 478

## 2012-01-03 ENCOUNTER — Encounter: Payer: Self-pay | Admitting: Family Medicine

## 2012-01-05 ENCOUNTER — Encounter: Payer: Self-pay | Admitting: Family Medicine

## 2012-02-02 ENCOUNTER — Encounter: Payer: Self-pay | Admitting: Family Medicine

## 2012-02-15 ENCOUNTER — Non-Acute Institutional Stay: Payer: Self-pay | Admitting: Family Medicine

## 2012-02-15 DIAGNOSIS — G309 Alzheimer's disease, unspecified: Secondary | ICD-10-CM

## 2012-02-15 DIAGNOSIS — E1129 Type 2 diabetes mellitus with other diabetic kidney complication: Secondary | ICD-10-CM

## 2012-02-15 DIAGNOSIS — I10 Essential (primary) hypertension: Secondary | ICD-10-CM

## 2012-02-15 NOTE — Assessment & Plan Note (Signed)
Gaining weight despite inability to perform any ADL's

## 2012-02-15 NOTE — Assessment & Plan Note (Signed)
Recent A1c result? Was normal in April. Still diabetic?

## 2012-02-15 NOTE — Progress Notes (Signed)
  Subjective:    [name], female    DOB: 1929-01-20, 78 y.o.   MRN: [id number]  HPI No recent events. She continues to eat what is put in her mouth.    Review of Systems     Objective:   Physical Exam  Constitutional:       Abdominal obesity  Cardiovascular: Normal rate and regular rhythm.   Pulmonary/Chest: Effort normal and breath sounds normal.  Abdominal: Soft.  Musculoskeletal:       Trace ankle edema  Neurological:       Sitting in geri chair Constant puckering movement of mouth.  No eye contact No verbal response Hands contracted grasping cushion devices          Assessment & Plan:

## 2012-02-15 NOTE — Assessment & Plan Note (Signed)
Occasionally elevated. Not on chronic medication

## 2012-02-20 ENCOUNTER — Non-Acute Institutional Stay: Payer: Self-pay | Admitting: Family Medicine

## 2012-02-20 DIAGNOSIS — E1129 Type 2 diabetes mellitus with other diabetic kidney complication: Secondary | ICD-10-CM

## 2012-02-20 DIAGNOSIS — G309 Alzheimer's disease, unspecified: Secondary | ICD-10-CM

## 2012-02-20 DIAGNOSIS — R635 Abnormal weight gain: Secondary | ICD-10-CM

## 2012-02-20 DIAGNOSIS — L89302 Pressure ulcer of unspecified buttock, stage 2: Secondary | ICD-10-CM

## 2012-02-20 NOTE — Progress Notes (Signed)
[name], female   DOB: 05-28-1928, 78 y.o.   MRN: [id number]  Subjective:    HPI Nonverbal nursing home resident. Since last visit she has continued weight gain and reopening or chronic R iliac decubitus ulcer. Additionally, her code status has been updated to DNR.     Review of Systems Unobtainable.     Objective:   Physical Exam There were no vitals filed for this visit. BP Readings from Last 3 Encounters:  02/13/12 122/84  02/11/12 163/84  10/08/11 133/73   Wt Readings from Last 3 Encounters:  02/13/12 173 lb 12.8 oz (78.835 kg)  09/11/11 165 lb 9.6 oz (75.116 kg)  07/30/11 162 lb 6.4 oz (73.664 kg)  General appearance: Asleep in bed. Lying on L sided.  Non verbal.  Lungs: clear to auscultation bilaterally Heart: regular rate and rhythm, S1, S2 normal, no murmur, click, rub or gallop Abdomen: soft, non-tender; bowel sounds normal; no masses,  no organomegaly Extremities: contractures and rigidity bilateral upper extremities.  No lower extremity edema.  Skin: R iliac stage two.  Neuro: Increased tone.  Pill rolling and cog wheel rigidity and lip smacking.   Lab Results  Component Value Date   HGBA1C 6.8* 02/02/2012   Lab Results  Component Value Date   WBC 6.7 10/20/2011   HGB 10.8* 02/02/2012   HCT 33 10/20/2011   MCV 74.7* 08/23/2008   PLT 217 01/07/2010   Lab Results  Component Value Date   TSH 2.896 02/02/2012   Lab Results  Component Value Date   IRON 57 12/29/2011   TIBC 313 12/29/2011   FERRITIN 51.0 01/02/2012   Current Outpatient Prescriptions on File Prior to Visit  Medication Sig Dispense Refill  . acetaminophen (TYLENOL) 325 MG tablet Take 650 mg by mouth 2 (two) times daily. Don't exceed 4000mg  of acetaminophen in 24 hours.      Marland Kitchen aspirin (ADULT ASPIRIN EC LOW STRENGTH) 81 MG EC tablet Take 1 tablet (81 mg total) by mouth daily.      . carbidopa-levodopa (SINEMET) 25-250 MG per tablet Take 1 tablet by mouth 3 (three) times daily. Take  prior to meals      . docusate sodium (COLACE) 100 MG capsule Take 100 mg by mouth daily.      . ergocalciferol (VITAMIN D2) 50000 UNIT capsule Take 50,000 Units by mouth every 30 (thirty) days. Given on 15th of month.      . ferrous sulfate 325 (65 FE) MG tablet Take 1 tablet (325 mg total) by mouth daily with breakfast.  30 tablet  11  . latanoprost (XALATAN) 0.005 % ophthalmic solution Place 1 drop into both eyes daily.      . Multiple Vitamin (MULTIVITAMIN) tablet Take 1 tablet by mouth daily.        . polyethylene glycol (MIRALAX) powder Take 17 g by mouth daily.        . timolol (TIMOPTIC-XE) 0.5 % ophthalmic gel-forming Place 1 drop into both eyes daily.       Assessment and Plan:      1.  78 yo F nursing home patient with advanced Parkinson's disease and Alzheimer's dementia. Stable. No aspiration.  Continue current medical management including restorative care to maintain upper extremity range of motion.   2. Weight gain: up 20 lbs since same time last year. d/c prostat. D/C double portions at meal time.   3. Pressure ulcer: continue dressing changes and frequent position changes.   4. DM2: A1c trending  up with weight gain. D/C prostat. D/C double portions at meal time.

## 2012-02-26 ENCOUNTER — Encounter: Payer: Self-pay | Admitting: Family Medicine

## 2012-02-26 DIAGNOSIS — R635 Abnormal weight gain: Secondary | ICD-10-CM | POA: Insufficient documentation

## 2012-02-26 DIAGNOSIS — L89301 Pressure ulcer of unspecified buttock, stage 1: Secondary | ICD-10-CM | POA: Insufficient documentation

## 2012-02-26 NOTE — Assessment & Plan Note (Signed)
Weight gain: up 20 lbs since same time last year. d/c prostat. D/C double portions at meal time.

## 2012-02-26 NOTE — Assessment & Plan Note (Signed)
DM2: A1c trending up with weight gain. D/C prostat. D/C double portions at meal time. Repeat A1c in 3 months.

## 2012-02-26 NOTE — Assessment & Plan Note (Signed)
Pressure ulcer: continue dressing changes and frequent position changes.

## 2012-04-03 LAB — BASIC METABOLIC PANEL
BUN, Bld: 19
CO2: 26 mmol/L
Chloride: 108 mmol/L
Glucose: 93
Potassium: 3.9 mmol/L

## 2012-04-06 ENCOUNTER — Non-Acute Institutional Stay: Payer: Self-pay | Admitting: Family Medicine

## 2012-04-06 NOTE — Progress Notes (Signed)
[name], female   DOB: 08/14/1928, 79 y.o.   MRN: [id number]  Subjective:    HPI Nonverbal nursing home resident. No recent events. She continues to eat by mouth.    Review of Systems Unobtainable.     Objective:   Physical Exam There were no vitals filed for this visit. BP Readings from Last 3 Encounters:  03/31/12 144/82  02/13/12 122/84  02/11/12 163/84   Wt Readings from Last 3 Encounters:  03/30/12 173 lb 3.2 oz (78.563 kg)  02/13/12 173 lb 12.8 oz (78.835 kg)  09/11/11 165 lb 9.6 oz (75.116 kg)  General appearance: Asleep in bed. Lying on L sided.  Non verbal.  Lungs: clear to auscultation bilaterally Heart: regular rate and rhythm, S1, S2 normal, no murmur, click, rub or gallop Abdomen: soft, non-tender; bowel sounds normal; no masses,  no organomegaly Extremities: contractures and rigidity bilateral upper extremities.  No lower extremity edema.  Neuro: Increased tone.  Pill rolling and cog wheel rigidity and lip smacking.   BMET BMET    Component Value Date/Time   NA 141 04/03/2012 1508   NA 140 07/21/2009   K 3.9 04/03/2012 1508   K 3.8 07/21/2009   CL 108 04/03/2012 1508   CL 110 08/23/2008 1208   CO2 26 04/03/2012 1508   CO2 21 08/23/2008 1208   GLUCOSE 233* 08/23/2008 1208   BUN 19 04/03/2012 1508   BUN 20 01/02/2012 0811   BUN 7 08/23/2008 1208   CREATININE 0.86 04/03/2012 1508   CREATININE 1.06 07/21/2009   CALCIUM 9.0 04/03/2012 1508   CALCIUM 9.0 08/23/2008 1208   GFRNONAA 41* 08/23/2008 1208   GFRAA  Value: 49        The eGFR has been calculated using the MDRD equation. This calculation has not been validated in all clinical situations. eGFR's persistently <60 mL/min signify possible Chronic Kidney Disease.* 08/23/2008 1208    Current Outpatient Prescriptions on File Prior to Visit  Medication Sig Dispense Refill  . acetaminophen (TYLENOL) 325 MG tablet Take 650 mg by mouth 2 (two) times daily. Don't exceed 4000mg  of acetaminophen in 24 hours.      Marland Kitchen  aspirin (ADULT ASPIRIN EC LOW STRENGTH) 81 MG EC tablet Take 1 tablet (81 mg total) by mouth daily.      . carbidopa-levodopa (SINEMET) 25-250 MG per tablet Take 1 tablet by mouth 3 (three) times daily. Take prior to meals      . docusate sodium (COLACE) 100 MG capsule Take 100 mg by mouth daily.      . ergocalciferol (VITAMIN D2) 50000 UNIT capsule Take 50,000 Units by mouth every 30 (thirty) days. Given on 15th of month.      . feeding supplement (PRO-STAT SUGAR FREE 64) LIQD Take 30 mLs by mouth 2 (two) times daily.      . ferrous sulfate 325 (65 FE) MG tablet Take 1 tablet (325 mg total) by mouth daily with breakfast.  30 tablet  11  . latanoprost (XALATAN) 0.005 % ophthalmic solution Place 1 drop into both eyes daily.      . Multiple Vitamin (MULTIVITAMIN) tablet Take 1 tablet by mouth daily.        . polyethylene glycol (MIRALAX) powder Take 17 g by mouth daily.        . timolol (TIMOPTIC-XE) 0.5 % ophthalmic gel-forming Place 1 drop into both eyes daily.       Assessment and Plan:      1.  79 yo F nursing home patient with advanced Parkinson's disease and Alzheimer's dementia. Stable. No aspiration.  Continue current medical management including restorative care to maintain upper extremity range of motion.   2. Weight: at a plateau. Patient continues to eat and remain hydrated. Based on exam and recent BMP.   3. BP: wnl last 2 months w/o medications. Removing HTN from active problem list.

## 2012-04-25 ENCOUNTER — Non-Acute Institutional Stay: Payer: Self-pay | Admitting: Family Medicine

## 2012-04-25 DIAGNOSIS — G20 Parkinson's disease: Secondary | ICD-10-CM

## 2012-04-25 DIAGNOSIS — G309 Alzheimer's disease, unspecified: Secondary | ICD-10-CM

## 2012-04-28 ENCOUNTER — Encounter: Payer: Self-pay | Admitting: Family Medicine

## 2012-04-28 NOTE — Progress Notes (Signed)
  Subjective:    [name], female    DOB: 1928/10/16, 79 y.o.   MRN: [id number]  HPI No new problems per staff. Continues to eat what's given to her.    Review of Systems     Objective:   Physical Exam  Cardiovascular: Normal rate and regular rhythm.   No murmur heard. Pulmonary/Chest: Effort normal and breath sounds normal.  Musculoskeletal: She exhibits no edema.       Contractures of hands and legs  Neurological: She is alert.       Positive Myerson's reflex          Assessment & Plan:

## 2012-04-28 NOTE — Assessment & Plan Note (Signed)
She remains the same and swallowing well.

## 2012-04-28 NOTE — Assessment & Plan Note (Signed)
Intermittently makes eye contact.

## 2012-05-25 ENCOUNTER — Encounter: Payer: Self-pay | Admitting: Family Medicine

## 2012-06-05 ENCOUNTER — Non-Acute Institutional Stay: Payer: Self-pay | Admitting: Family Medicine

## 2012-06-06 NOTE — Progress Notes (Signed)
[name], female   DOB: 18-Feb-1929, 79 y.o.   MRN: [id number] Subjective:    HPI Nonverbal nursing home resident with advanced Parkinsonism, completely dependent in ADLs seen for regular follow up visit. No acute events. She continues to eat by mouth. No significant aspiration.     Review of Systems Unobtainable.     Objective:   Physical Exam Filed Vitals:   05/19/12 2255  BP: 159/71  Pulse: 63  Temp: 97.7 F (36.5 C)  Resp: 18   BP Readings from Last 3 Encounters:  05/19/12 159/71  03/31/12 144/82  02/13/12 122/84   Wt Readings from Last 3 Encounters:  05/02/12 176 lb 6.4 oz (80.015 kg)  03/30/12 173 lb 3.2 oz (78.563 kg)  02/13/12 173 lb 12.8 oz (78.835 kg)  General appearance: Asleep in bed. Lying on L sided.  Non verbal.  Lungs: clear to auscultation bilaterally Heart: regular rate and rhythm, S1, S2 normal, no murmur, click, rub or gallop Abdomen: soft, non-tender; bowel sounds normal; no masses,  no organomegaly Extremities: contractures and rigidity bilateral upper extremities.  No lower extremity edema.  Neuro: Increased tone.   Skin: healed L first metatarsal head skin brekdown.  Healed L gluteal skin breakdown.   Lab Results  Component Value Date   HGBA1C 7.4* 05/25/2012   Lab Results  Component Value Date   HGB 11.4* 05/17/2012    Current Outpatient Prescriptions on File Prior to Visit  Medication Sig Dispense Refill  . acetaminophen (TYLENOL) 325 MG tablet Take 650 mg by mouth 2 (two) times daily. Don't exceed 4000mg  of acetaminophen in 24 hours.      Marland Kitchen aspirin (ADULT ASPIRIN EC LOW STRENGTH) 81 MG EC tablet Take 1 tablet (81 mg total) by mouth daily.      . carbidopa-levodopa (SINEMET) 25-250 MG per tablet Take 1 tablet by mouth 3 (three) times daily. Take prior to meals      . docusate sodium (COLACE) 100 MG capsule Take 100 mg by mouth daily.      . ergocalciferol (VITAMIN D2) 50000 UNIT capsule Take 50,000 Units by mouth every 30  (thirty) days. Given on 15th of month.      . feeding supplement (PRO-STAT SUGAR FREE 64) LIQD Take 30 mLs by mouth 2 (two) times daily.      . ferrous sulfate 325 (65 FE) MG tablet Take 1 tablet (325 mg total) by mouth daily with breakfast.  30 tablet  11  . latanoprost (XALATAN) 0.005 % ophthalmic solution Place 1 drop into both eyes daily.      . Multiple Vitamin (MULTIVITAMIN) tablet Take 1 tablet by mouth daily.        . polyethylene glycol (MIRALAX) powder Take 17 g by mouth daily.        . timolol (TIMOPTIC-XE) 0.5 % ophthalmic gel-forming Place 1 drop into both eyes daily.       No current facility-administered medications on file prior to visit.   Assessment and Plan:      1. 79 yo F nursing home patient with advanced Parkinsonism and Alzheimer's dementia. Stable exam. No aspiration.  Continue current medical management including restorative care to maintain upper extremity range of motion.   2. DM2: A1c on the rise. Will recheck in 3 months. Re-iterate no double portions. I do not believe patient is still on prostat but I will double check and remove it from med list if she is.    3. Weight: slight increase from  last visit.  Patient continues to eat well and remain hydrated.   4. BP: Elevated. No current medications. Plan for weekly BP checks x next two weeks. Will start ACE inhibitor if persistently elevated.    5. IDA: improved Hgb. MCV still low. Will continue iron therapy.

## 2012-06-19 ENCOUNTER — Encounter: Payer: Self-pay | Admitting: Pharmacist

## 2012-06-20 ENCOUNTER — Non-Acute Institutional Stay: Payer: PRIVATE HEALTH INSURANCE | Admitting: Family Medicine

## 2012-06-20 DIAGNOSIS — Z6825 Body mass index (BMI) 25.0-25.9, adult: Secondary | ICD-10-CM

## 2012-06-20 DIAGNOSIS — R030 Elevated blood-pressure reading, without diagnosis of hypertension: Secondary | ICD-10-CM

## 2012-06-20 DIAGNOSIS — G20 Parkinson's disease: Secondary | ICD-10-CM

## 2012-06-20 DIAGNOSIS — M6289 Other specified disorders of muscle: Secondary | ICD-10-CM

## 2012-06-20 DIAGNOSIS — E663 Overweight: Secondary | ICD-10-CM

## 2012-06-20 DIAGNOSIS — E1129 Type 2 diabetes mellitus with other diabetic kidney complication: Secondary | ICD-10-CM

## 2012-06-20 DIAGNOSIS — M623 Immobility syndrome (paraplegic): Secondary | ICD-10-CM

## 2012-06-22 DIAGNOSIS — E663 Overweight: Secondary | ICD-10-CM | POA: Insufficient documentation

## 2012-06-22 NOTE — Assessment & Plan Note (Signed)
Slowly increasing

## 2012-06-22 NOTE — Assessment & Plan Note (Signed)
May need to limit calories to avoid problems of obesity.

## 2012-06-22 NOTE — Progress Notes (Unsigned)
  Subjective:    [name], female    DOB: 04-Aug-1928, 79 y.o.   MRN: [id number]  HPI Per her grandaughter, Seward Meth, she is stable and continues to eat whatever is offered her. In fact, she is gaining weight.    Review of Systems     Objective:   Physical Exam  Cardiovascular: Normal rate and regular rhythm.   Pulmonary/Chest: Effort normal and breath sounds normal. No respiratory distress. She has no rales.  Musculoskeletal:  Contractures of ankles and wrists  Neurological:  Opens her eyes when touched.  No verbal response Rigidity with mild cogwheel resistance to movement  Minimal mobility, spends her time reclined in [place] chair.           Assessment & Plan:

## 2012-06-22 NOTE — Assessment & Plan Note (Signed)
Remains on Sinemet, mainly to preserve swallowing

## 2012-06-22 NOTE — Progress Notes (Signed)
  Subjective:    [name], female    DOB: 04-02-1928, 79 y.o.   MRN: [id number]  HPI She remains stable, per her grandaughter, Seward Meth, the director of nursing. She eats most everything that is presented to her.    Review of Systems     Objective:   Physical Exam  Constitutional: She appears well-developed and well-nourished.  Cardiovascular: Normal rate and regular rhythm.   No murmur heard. Pulmonary/Chest: Effort normal and breath sounds normal. She has no rales.  Musculoskeletal: She exhibits no edema.  Neurological:  She awakens when touched but no verbal response          Assessment & Plan:

## 2012-06-22 NOTE — Assessment & Plan Note (Signed)
Remains in normal range

## 2012-07-26 ENCOUNTER — Encounter: Payer: Self-pay | Admitting: Pharmacist

## 2012-08-09 ENCOUNTER — Non-Acute Institutional Stay (INDEPENDENT_AMBULATORY_CARE_PROVIDER_SITE_OTHER): Payer: PRIVATE HEALTH INSURANCE | Admitting: Family Medicine

## 2012-08-09 DIAGNOSIS — Z593 Problems related to living in residential institution: Secondary | ICD-10-CM

## 2012-08-09 NOTE — Progress Notes (Signed)
[name], female   DOB: 18-Nov-1928, 79 y.o.   MRN: [id number] Subjective:    HPI Nonverbal nursing home resident with advanced Parkinsonism, completely dependent in ADLs seen for regular follow up visit. No acute events. She continues to eat by mouth. No acute events since last visit.    Review of Systems Unobtainable.     Objective:   Physical Exam There were no vitals filed for this visit. BP Readings from Last 3 Encounters:  08/10/12 165/80  05/19/12 159/71  03/31/12 144/82  recent BP range: 114-175/62-75 Wt Readings from Last 3 Encounters:  08/11/12 174 lb 9.6 oz (79.198 kg)  05/02/12 176 lb 6.4 oz (80.015 kg)  03/30/12 173 lb 3.2 oz (78.563 kg)  General appearance: Asleep in bed.  Non verbal.  Lungs: clear to auscultation bilaterally Heart: regular rate and rhythm, S1, S2 normal, no murmur, click, rub or gallop Abdomen: soft, non-tender; bowel sounds normal; no masses,  no organomegaly Extremities: contractures and rigidity bilateral upper extremities.  No lower extremity edema.  Neuro: Increased tone.   Skin: healed L first metatarsal head skin brekdown.  Healed L gluteal skin breakdown.   Lab Results  Component Value Date   HGBA1C 7.4* 05/25/2012   Lab Results  Component Value Date   HGB 11.4* 05/17/2012    Current Outpatient Prescriptions on File Prior to Visit  Medication Sig Dispense Refill  . acetaminophen (TYLENOL) 325 MG tablet Take 650 mg by mouth 2 (two) times daily. Don't exceed 4000mg  of acetaminophen in 24 hours.      Marland Kitchen aspirin (ADULT ASPIRIN EC LOW STRENGTH) 81 MG EC tablet Take 1 tablet (81 mg total) by mouth daily.      . carbidopa-levodopa (SINEMET) 25-250 MG per tablet Take 1 tablet by mouth 3 (three) times daily. Take prior to meals      . docusate sodium (COLACE) 100 MG capsule Take 100 mg by mouth daily.      . ergocalciferol (VITAMIN D2) 50000 UNIT capsule Take 50,000 Units by mouth every 30 (thirty) days. Given on 15th of month.       . feeding supplement (PRO-STAT SUGAR FREE 64) LIQD Take 30 mLs by mouth 2 (two) times daily.      . ferrous sulfate 325 (65 FE) MG tablet Take 1 tablet (325 mg total) by mouth daily with breakfast.  30 tablet  11  . latanoprost (XALATAN) 0.005 % ophthalmic solution Place 1 drop into both eyes daily.      . Multiple Vitamin (MULTIVITAMIN) tablet Take 1 tablet by mouth daily.        . polyethylene glycol (MIRALAX) powder Take 17 g by mouth daily.        . timolol (TIMOPTIC-XE) 0.5 % ophthalmic gel-forming Place 1 drop into both eyes daily.       No current facility-administered medications on file prior to visit.   Assessment and Plan:      1. 79 yo F nursing home patient with advanced Parkinsonism and Alzheimer's dementia. Stable exam. No aspiration.  Continue current medical management including restorative care to maintain upper extremity range of motion.   2. DM2: due for repeat A1c this month.   3. Weight: Patient continues to eat well and remain hydrated without evidence of aspiration.   4. BP: labile. Generally well controlled, in goal range. No BP medication at this time.   5. Code: DNR.

## 2012-08-21 ENCOUNTER — Non-Acute Institutional Stay: Payer: PRIVATE HEALTH INSURANCE | Admitting: Family Medicine

## 2012-08-21 DIAGNOSIS — M623 Immobility syndrome (paraplegic): Secondary | ICD-10-CM

## 2012-08-21 DIAGNOSIS — M6289 Other specified disorders of muscle: Secondary | ICD-10-CM

## 2012-08-21 DIAGNOSIS — E663 Overweight: Secondary | ICD-10-CM

## 2012-08-21 DIAGNOSIS — G309 Alzheimer's disease, unspecified: Secondary | ICD-10-CM

## 2012-08-21 DIAGNOSIS — Z6825 Body mass index (BMI) 25.0-25.9, adult: Secondary | ICD-10-CM

## 2012-08-21 NOTE — Assessment & Plan Note (Signed)
Slow decline in interactions

## 2012-08-21 NOTE — Progress Notes (Signed)
  Subjective:    [name], female    DOB: 1928-08-08, 80 y.o.   MRN: [id number]  HPI No problems per her grand daughter and DON, [name].  Diet - changed to nectar thick liquids due to aspiration risk on speech evaluation  Pacer - due for a check  Review of Systems     Objective:   Physical Exam  Constitutional: She appears well-developed.  Central obesity Semi-reclined in [place] chair with arms held contracted across her chest and hands grasping palmar pads.   Cardiovascular: Normal rate and regular rhythm.   No murmur heard. Pulmonary/Chest: Effort normal.  Abdominal: Soft. She exhibits no distension and no mass. There is no tenderness. There is no rebound and no guarding.  Musculoskeletal: She exhibits no edema.  Arms couldn't be extended or fingers openned  LE knees lack 30 degrees of extension  Neurological:  Opens eyes after several requests.   Skin: Skin is warm and dry.          Assessment & Plan:

## 2012-08-21 NOTE — Assessment & Plan Note (Signed)
Continues to gain weight. We'll see how she does with the change to nectar thick diet started 5/19. Good intake when fed.

## 2012-08-21 NOTE — Assessment & Plan Note (Signed)
No change 

## 2012-10-04 ENCOUNTER — Other Ambulatory Visit: Payer: Self-pay

## 2012-10-17 ENCOUNTER — Ambulatory Visit: Payer: PRIVATE HEALTH INSURANCE | Admitting: Cardiology

## 2012-10-24 ENCOUNTER — Non-Acute Institutional Stay (SKILLED_NURSING_FACILITY): Payer: PRIVATE HEALTH INSURANCE | Admitting: Family Medicine

## 2012-10-24 DIAGNOSIS — L89309 Pressure ulcer of unspecified buttock, unspecified stage: Secondary | ICD-10-CM

## 2012-10-24 DIAGNOSIS — L8992 Pressure ulcer of unspecified site, stage 2: Secondary | ICD-10-CM

## 2012-10-24 DIAGNOSIS — L89302 Pressure ulcer of unspecified buttock, stage 2: Secondary | ICD-10-CM

## 2012-10-24 NOTE — Progress Notes (Signed)
Subjective:     [name], female   DOB: [birth year], 79 y.o.   MRN: [id number]  HPI 79 year old female nursing home patient seen for routine followup. Patient has advanced Parkinson's with Lewy body dementia and is completely dependent for assistance with ADLs. The staff has no complaints or concerns. Patient had a stage 2 sacral decubitus ulcer that is healing very well per staff.  Patient continues to eat with assistance.  CODE STATUS reviewed: DO NOT RESUSCITATE.  Review of Systems Unable to obtain    Objective:   Physical Exam BP 174/79  Pulse 65  Temp(Src) 97.3 F (36.3 C)  Resp 17  Wt 174 lb 12.8 oz (79.289 kg)  BMI 27.37 kg/m2 Blood pressure range of her last month: 126-149/53-70 CBGs: 133 10/22/12 119 10/15/12 General appearance: alert and no distress Lungs: clear to auscultation bilaterally Heart: regular rate and rhythm, S1, S2 normal, no murmur, click, rub or gallop Abdomen: soft, non-tender; bowel sounds normal; no masses,  no organomegaly Extremities: extremities normal, atraumatic, no cyanosis or edema.  Skin: 2x2 cm stage II sacaral decub. Nearly healed. No heel ulcers. No rashes.  Neurologic: Mask facies. Oral dyskinesia. Pill rolling. Cogwheel rigidity. Up going Babinski.  Current Outpatient Prescriptions on File Prior to Visit  Medication Sig Dispense Refill  . acetaminophen (TYLENOL) 325 MG tablet Take 650 mg by mouth 2 (two) times daily. Don't exceed 4000mg  of acetaminophen in 24 hours.      Marland Kitchen aspirin (ADULT ASPIRIN EC LOW STRENGTH) 81 MG EC tablet Take 1 tablet (81 mg total) by mouth daily.      . carbidopa-levodopa (SINEMET) 25-250 MG per tablet Take 1 tablet by mouth 3 (three) times daily. Take prior to meals      . docusate sodium (COLACE) 100 MG capsule Take 100 mg by mouth daily.      . ergocalciferol (VITAMIN D2) 50000 UNIT capsule Take 50,000 Units by mouth every 30 (thirty) days. Given on 15th of month.      . feeding supplement  (PRO-STAT SUGAR FREE 64) LIQD Take 30 mLs by mouth 2 (two) times daily.      . ferrous sulfate 325 (65 FE) MG tablet Take 1 tablet (325 mg total) by mouth daily with breakfast.  30 tablet  11  . latanoprost (XALATAN) 0.005 % ophthalmic solution Place 1 drop into both eyes daily.      . Multiple Vitamin (MULTIVITAMIN) tablet Take 1 tablet by mouth daily.        . polyethylene glycol (MIRALAX) powder Take 17 g by mouth daily.        . timolol (TIMOPTIC-XE) 0.5 % ophthalmic gel-forming Place 1 drop into both eyes daily.       No current facility-administered medications on file prior to visit.    Assessment and Plan:        79 year old female nursing home patient with advanced Parkinson's/Lewy body dementia completely dependent on assistance with ADLs, diabetes type 2, iron deficiency anemia, elevated blood pressure seen for routine followup.  #1 advanced Parkinson's/Lewy body dementia: Patient remained stable. She continues to eat and is able to maintain a normal way. She continues on Sinemet. No medication changes necessary at this time.  #2 elevated blood pressure: Patient with elevated blood pressures in the past. Her most recent blood pressure is also elevated. Plan is to check blood pressures daily for the next 3 days to return to weekly. Plan to also obtain a beam at this patient has  a history of CKD. If she continues to have elevated blood pressure she may benefit from an ACE or an R. in the setting of diabetes and chronic kidney disease.  #3 stage II sacral decub: Healing. No change in treatment plan needed.  #4 iron deficiency anemia: Patient has oral iron order. Will repeat a CBC.  #5 type 2 diabetes: Patient is non-insulin-dependent with an A1c at goal of less than 8 when last checked. She is due for repeat A1c and this has been ordered.

## 2012-10-24 NOTE — Assessment & Plan Note (Signed)
Very small, stage I,  healing well.

## 2012-11-17 ENCOUNTER — Non-Acute Institutional Stay (INDEPENDENT_AMBULATORY_CARE_PROVIDER_SITE_OTHER): Payer: PRIVATE HEALTH INSURANCE | Admitting: Family Medicine

## 2012-11-17 DIAGNOSIS — G20 Parkinson's disease: Secondary | ICD-10-CM

## 2012-11-17 DIAGNOSIS — G20A1 Parkinson's disease without dyskinesia, without mention of fluctuations: Secondary | ICD-10-CM

## 2012-11-17 DIAGNOSIS — IMO0001 Reserved for inherently not codable concepts without codable children: Secondary | ICD-10-CM

## 2012-11-17 DIAGNOSIS — R635 Abnormal weight gain: Secondary | ICD-10-CM

## 2012-11-17 DIAGNOSIS — E1129 Type 2 diabetes mellitus with other diabetic kidney complication: Secondary | ICD-10-CM

## 2012-11-17 DIAGNOSIS — R030 Elevated blood-pressure reading, without diagnosis of hypertension: Secondary | ICD-10-CM

## 2012-11-17 DIAGNOSIS — D509 Iron deficiency anemia, unspecified: Secondary | ICD-10-CM

## 2012-11-17 NOTE — Assessment & Plan Note (Signed)
Wt fairly stable w/ only 2lb wt gain in past month.

## 2012-11-17 NOTE — Assessment & Plan Note (Signed)
Continue w/ sinemet TID  Will discuss utility of increasing dose to QID w/ care team and pharmacy.

## 2012-11-17 NOTE — Assessment & Plan Note (Signed)
Tolerating iron No CBC in greater than 1 yr Will order CBC

## 2012-11-17 NOTE — Progress Notes (Signed)
Family Medicine [facility] Progress Note    [name] is a 79 y.o. female resident of [facility]. Pt is noncommunicating and lying in bed w/ hands clasp w/ arms in flexion.   The following portions of the patient's history were reviewed and updated as appropriate: allergies, current medications, past medical history, family and social history, and problem list.   Past Medical History  Diagnosis Date  . Depression   . Chronic kidney disease   . Hypertension   . Parkinson's disease     5/08 psych consult with Dr. Barbarann Ehlers, transient dfelirium, mood disorder with insomnia. 5/10 CT  scan severe atrophy. Postural tremor.  . Diabetes type 2, controlled   . Anemia   . 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 Peripheral arterial disease   . DJD (degenerative joint disease)   . Glaucoma(365)   . Helicobacter pylori (H. pylori) infection 12 2004     treated   . Trochanteric bursitis of left hip   . Carpal tunnel syndrome     ROS as above otherwise neg.    Medications reviewed. Current Outpatient Prescriptions  Medication Sig Dispense Refill  . acetaminophen (TYLENOL) 325 MG tablet Take 650 mg by mouth 2 (two) times daily. Don't exceed 4000mg  of acetaminophen in 24 hours.      Marland Kitchen aspirin (ADULT ASPIRIN EC LOW STRENGTH) 81 MG EC tablet Take 1 tablet (81 mg total) by mouth daily.      . carbidopa-levodopa (SINEMET) 25-250 MG per tablet Take 1 tablet by mouth 3 (three) times daily. Take prior to meals      . docusate sodium (COLACE) 100 MG capsule Take 100 mg by mouth daily.      . ergocalciferol (VITAMIN D2) 50000 UNIT capsule Take 50,000 Units by mouth every 30 (thirty) days. Given on 15th of month.      . feeding supplement (PRO-STAT SUGAR FREE 64) LIQD Take 30 mLs by mouth 2 (two) times daily.      . ferrous sulfate 325 (65 FE) MG tablet Take 1 tablet (325 mg total) by mouth daily with breakfast.  30 tablet  11  . latanoprost (XALATAN) 0.005  % ophthalmic solution Place 1 drop into both eyes daily.      . Multiple Vitamin (MULTIVITAMIN) tablet Take 1 tablet by mouth daily.        . polyethylene glycol (MIRALAX) powder Take 17 g by mouth daily.        . timolol (TIMOPTIC-XE) 0.5 % ophthalmic gel-forming Place 1 drop into both eyes daily.       No current facility-administered medications for this visit.    Exam:  Vital Signs: Temp: 96.6, HR: 65 Res: 17, BP 134/69  Wt 7/30 174.8lbs Wt 8/13 177lbs  AM CBG for past 3 wks 75, 150, 102  Gen: Well NA, lying in bed awake but not communicating.  HEENT: EOMI,  MMM Lungs: CTABL Nl WOB Heart: RRR no MRG Abd: NABS, NT, ND, soft. Exts: Non edematous BL  LE, warm and well perfused. 2+ pulses MSK: hands held in flexed position w/ soft cloth objects held firmly. Arms held in persistent flexion. MIld oral dyskinesia present  No results found for this or any previous visit (from the past 72 hour(s)).  Assessment:  79yo pleasant nursing home resident, AAF w/ PMH of advanced parkinson's/Lewy Body dementia, DM, iron deficiency anemia, and glaucoma. Marked progression of parkinsonian/lewy body demential. Pt now completely non-verbal but  taking PO.

## 2012-11-17 NOTE — Assessment & Plan Note (Signed)
Continue w/ latanoprost and timolol eye drops

## 2012-11-17 NOTE — Assessment & Plan Note (Signed)
Normal recent BP readings.

## 2012-11-17 NOTE — Assessment & Plan Note (Signed)
Most recent CBG appropriate from 75-150 range w/o DM medications.  Continue to monitor Hgb A1c ordered

## 2012-12-05 ENCOUNTER — Non-Acute Institutional Stay: Payer: PRIVATE HEALTH INSURANCE | Admitting: Family Medicine

## 2012-12-05 DIAGNOSIS — R030 Elevated blood-pressure reading, without diagnosis of hypertension: Secondary | ICD-10-CM

## 2012-12-05 DIAGNOSIS — R05 Cough: Secondary | ICD-10-CM | POA: Insufficient documentation

## 2012-12-05 DIAGNOSIS — R9389 Abnormal findings on diagnostic imaging of other specified body structures: Secondary | ICD-10-CM

## 2012-12-05 NOTE — Assessment & Plan Note (Signed)
Blood pressure in acceptable range per JNC 8 guidelines. No changes in pharmacotherapy made today.

## 2012-12-05 NOTE — Assessment & Plan Note (Signed)
Cough appears to be resolved. No current URI type symptoms. May have been secondary to atelectasis. We'll continue to monitor clinically.

## 2012-12-05 NOTE — Progress Notes (Signed)
  Subjective:    [name], female    DOB: 01/17/1929, 79 y.o.   MRN: [id number]  HPI 79 year old [place] American female seen and evaluated at [facility] nursing home. Patient is seen today for acute visit due to reported increased cough or nursing home staff, she was recently evaluated by the resident physician and underwent chest x-ray which showed left lower lobe infiltrate suspicious for pneumonia versus atelectasis. Patient is unable to provide medical history secondary to dementia. Per staff her symptoms have been stable and has not been worsening. She is not currently on antibiotic coverage. No reported fevers or chills, no emesis, tolerating diet, no changes in bowel habits.   Review of Systems  Constitutional: Negative for fever and chills.  Respiratory: Positive for cough. Negative for shortness of breath and wheezing.   Cardiovascular: Negative for chest pain.       Objective:   Physical Exam Vitals: Reviewed General: Presently demented female sleeping in wheelchair Cardiac: Regular rate and rhythm, S1 and S2 present, 3/6 systolic murmur, no heaves or thrills Respiratory: difficult exam due to patient not being able to take deep breaths, poor air entry noted diffusely, no rhonchi, no crackles Abdomen: Soft, nontender, bowel sounds present  CBC performed on 12/04/2012 showed WBC of 8.6, hemoglobin of 11.1, hematocrit 34.3, and platelets of 168.       Assessment & Plan:  Please see problem specific assessment and plan.

## 2012-12-05 NOTE — Assessment & Plan Note (Signed)
Recent x-ray identified left lower lobe infiltrate suspicious for pneumonia versus atelectasis. Patient has significant risk factors for atelectasis as she is wheelchair-bound and immobile. She is currently afebrile and has a normal WBC. Clinically she is not appear to have pneumonia -Will continue to monitor her respiratory status -Will not start on antibiotics at this time however would have low threshold to initiate if clinical picture changes

## 2012-12-18 ENCOUNTER — Encounter: Payer: Self-pay | Admitting: Student-PharmD

## 2013-01-14 ENCOUNTER — Encounter: Payer: Self-pay | Admitting: Pharmacist

## 2013-01-18 ENCOUNTER — Non-Acute Institutional Stay (INDEPENDENT_AMBULATORY_CARE_PROVIDER_SITE_OTHER): Payer: PRIVATE HEALTH INSURANCE | Admitting: Family Medicine

## 2013-01-18 DIAGNOSIS — F0280 Dementia in other diseases classified elsewhere without behavioral disturbance: Secondary | ICD-10-CM

## 2013-01-18 DIAGNOSIS — E1129 Type 2 diabetes mellitus with other diabetic kidney complication: Secondary | ICD-10-CM

## 2013-01-18 DIAGNOSIS — G20 Parkinson's disease: Secondary | ICD-10-CM

## 2013-01-18 DIAGNOSIS — N189 Chronic kidney disease, unspecified: Secondary | ICD-10-CM

## 2013-01-18 DIAGNOSIS — H409 Unspecified glaucoma: Secondary | ICD-10-CM

## 2013-01-18 NOTE — Progress Notes (Signed)
Family Medicine Nursing Home Note [facility] and Living  Room [address] [name] is a 79 y.o. female who is bed bound at [facility]. She was in her usual state today. She was lying in bed w/ her arms flexed holding her hand/grip supports. She would occasionally blink but maintained a straightforward gaze. She is completely non-communicative and non-participatory. There are no concerns from the nursing home staff.   There seem to be no lasting effects from the upper respiratory illness she contracted several weeks ago an appears to be at her baseline.   The following portions of the patient's history were reviewed and updated as appropriate: allergies, current medications, past medical history, family and social history, and problem list.  Patient is a nonsmoker.   Past Medical History  Diagnosis Date  . Depression   . Chronic kidney disease   . Hypertension   . Parkinson's disease     5/08 psych consult with Dr. Barbarann Ehlers, transient dfelirium, mood disorder with insomnia. 5/10 CT  scan severe atrophy. Postural tremor.  . Diabetes type 2, controlled   . Anemia   . 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 Peripheral arterial disease   . DJD (degenerative joint disease)   . Glaucoma(365)   . Helicobacter pylori (H. pylori) infection 12 2004     treated   . Trochanteric bursitis of left hip   . Carpal tunnel syndrome     ROS as above otherwise neg.    Medications reviewed. Current Outpatient Prescriptions  Medication Sig Dispense Refill  . acetaminophen (TYLENOL) 325 MG tablet Take 650 mg by mouth 2 (two) times daily. Don't exceed 4000mg  of acetaminophen in 24 hours.      Marland Kitchen aspirin (ADULT ASPIRIN EC LOW STRENGTH) 81 MG EC tablet Take 1 tablet (81 mg total) by mouth daily.      . carbidopa-levodopa (SINEMET) 25-250 MG per tablet Take 1 tablet by mouth 3 (three) times daily. Take prior to meals      . docusate sodium (COLACE) 100 MG capsule Take 100 mg by  mouth daily.      . ergocalciferol (VITAMIN D2) 50000 UNIT capsule Take 50,000 Units by mouth every 30 (thirty) days. Given on 15th of month.      . ferrous sulfate 325 (65 FE) MG tablet Take 1 tablet (325 mg total) by mouth daily with breakfast.  30 tablet  11  . latanoprost (XALATAN) 0.005 % ophthalmic solution Place 1 drop into both eyes daily.      . Multiple Vitamin (MULTIVITAMIN) tablet Take 1 tablet by mouth daily.        . polyethylene glycol (MIRALAX) powder Take 17 g by mouth daily.        . timolol (TIMOPTIC-XE) 0.5 % ophthalmic gel-forming Place 1 drop into both eyes daily.       No current facility-administered medications for this visit.    Exam: 01/15/13 VS: T 97.6, Pulse 70, Res 21, BP 132/66 Wt 9/3 173lbs, 10/13 168  Gen: Well NAD, non-communicative HEENT: MMM, blank forward staring gaze Lungs: CTABL in anterior lung fields Nl WOB, on RA Heart: RRR no MRG, 2+ distal pulses Abd: NABS, NT, ND Exts: trace LE edema MSK: UE w/ permanently flexed arms and hands.  Skin: intact - did not roll pt due contractures/pain and no reports of decubitus ulcers.     A/P: 79yo NH pt in her usual state of health w/ h/o depression, CKD, HTN, Parkinson's,  DM, anemia, PAD, Glaucoma, and dementia.  # Parkinson's: Continues to have contractures that make ambulation impossible and requires pt to remain in lying position. Continue Sinemet 25/250 TID. Continue PT as able to minimize further loss of mobility, though somewhat futile  # Dementia: non-communicative/participatory. Little to know benefit w/ Cholinesterase inh. Continue to monitor for further cogtnitive decline. Imagine she will one day develop oral aversion which will likely lead to eventual passing.  - +/- Palliative Care consult for GOC  # CKD II: Stable. Likely from prolonged DM and HTN. No change in therapy -BMET Q monthly  # DM: Last A1c in Epic of 7.4. OK to monitor at this time. No oral or injectables. - Repeat A1c  Q22mo  # Galucoma: Unsure of pts visual acuity due to mentation/parkinson's. Continue timolol and latanoprost  #FEN/GI: Takes PO w/o difficulty. Wt down 173 to 168 over past month. OK w/ 5lb wt change at this time. Trend Wt Q wkly.  # Pt is DNR which is appropriate  Shelly Flatten, MD Family Medicine PGY-3 01/21/2013, 1:58 PM

## 2013-01-21 NOTE — Assessment & Plan Note (Signed)
Unsure of pts visual acuity due to mentation/parkinson's. Continue timolol and latanoprost

## 2013-01-21 NOTE — Assessment & Plan Note (Signed)
Continues to have contractures that make ambulation impossible and requires pt to remain in lying position. Continue Sinemet 25/250 TID. Continue PT as able to minimize further loss of mobility, though somewhat futile

## 2013-01-21 NOTE — Assessment & Plan Note (Signed)
Stable. Likely from prolonged DM and HTN. No change in therapy -BMET Q monthly

## 2013-01-21 NOTE — Assessment & Plan Note (Signed)
Last A1c in Epic of 7.4 (04/2012). Repeat A1c Q36mo. OK to monitor at this time. No oral or injectables

## 2013-01-21 NOTE — Assessment & Plan Note (Signed)
non-communicative/participatory. Little to know benefit w/ Cholinesterase inh. Continue to monitor for further cogtnitive decline. Imagine she will one day develop oral aversion which will likely lead to eventual passing.

## 2013-02-08 ENCOUNTER — Non-Acute Institutional Stay: Payer: PRIVATE HEALTH INSURANCE | Admitting: Family Medicine

## 2013-02-08 DIAGNOSIS — L89311 Pressure ulcer of right buttock, stage 1: Secondary | ICD-10-CM

## 2013-02-08 DIAGNOSIS — L8991 Pressure ulcer of unspecified site, stage 1: Secondary | ICD-10-CM

## 2013-02-08 DIAGNOSIS — D509 Iron deficiency anemia, unspecified: Secondary | ICD-10-CM

## 2013-02-08 DIAGNOSIS — E1129 Type 2 diabetes mellitus with other diabetic kidney complication: Secondary | ICD-10-CM

## 2013-02-08 DIAGNOSIS — H409 Unspecified glaucoma: Secondary | ICD-10-CM

## 2013-02-08 DIAGNOSIS — L89309 Pressure ulcer of unspecified buttock, unspecified stage: Secondary | ICD-10-CM

## 2013-02-08 DIAGNOSIS — I10 Essential (primary) hypertension: Secondary | ICD-10-CM

## 2013-02-08 NOTE — Progress Notes (Signed)
  Subjective:    [name], female    DOB: [birth year], 79 y.o.   MRN: [id number]  HPI 79 year old female with past medical history of non-insulin-dependent type 2 diabetes, Alzheimer's disease, parkinsonism, immobility, iron deficiency anemia, glaucoma, decubitus ulcer of the right issue. She was seen and evaluated at [facility] on 02/06/2013.  Patient remains non-communicative. She is currently sitting in her wheelchair. No family members are present. No acute concerns identified by the nursing staff.   Review of Systems Unable to obtain due to severe dementia.    Objective:   Physical Exam Vitals: Reviewed General: African American female, nursing home resident, sitting in chair, noncommunicative Cardiac: Regular rate and rhythm, S1 and S2 present, no murmurs, no heaves or thrills Rester: Clear to auscultation bilaterally, normal effort Abdomen: Soft, nontender, bowel sounds present, no rebound, no guarding Extremities: Trace bilateral lower extremity edema, bilateral foot examination identified no ulcerations, Vasculature: 2+ radial pulses bilaterally, 2+ dorsalis pedis pulses bilaterally Skin: Decubitus ulcer was not examined however per recent evaluation by [facility] her right ischial decubitus ulcer is now stage II  Laboratory: Basic metabolic panel performed on 01/01/2013 showed sodium of 139, potassium of 3.8, chloride of 110, bicarbonate of 26, BUN of 16, creatinine of 1.0; CBC performed on 12/04/2012 showed WBC of 8.6, hemoglobin of 11.1, hematocrit 34.3, platelets of 168       Assessment & Plan:  Please see problem specific assessment and plan.

## 2013-02-08 NOTE — Assessment & Plan Note (Signed)
Agree with the resident dictation it is unclear what patient's visual acuity is. Agree with continuation of timolol and latanoprost for the time being. However if patient is transferred to hospice care would consider discontinuation of these medications. It is also unclear when her last ophthalmology appointment was. I will leave it up to her PCP to determine if he feels that this would be beneficial.

## 2013-02-08 NOTE — Assessment & Plan Note (Signed)
Now stage II pressure ulcer based on most recent evaluation by [facility].  -Continue local wound care and repositioning.

## 2013-02-08 NOTE — Assessment & Plan Note (Signed)
Well-controlled. Not currently on pharmacotherapy

## 2013-02-08 NOTE — Assessment & Plan Note (Signed)
Well-controlled based on most recent hemoglobin A1c. Creatinine appears stable. Not currently on ACE inhibitor due to history of hypotension. Foot examination performed today and unremarkable. Diet controlled -No changes to current therapy

## 2013-02-08 NOTE — Assessment & Plan Note (Signed)
Hemoglobin stable (11.1 on 12/04/2012). Continue iron supplementation.

## 2013-03-05 ENCOUNTER — Non-Acute Institutional Stay (INDEPENDENT_AMBULATORY_CARE_PROVIDER_SITE_OTHER): Payer: PRIVATE HEALTH INSURANCE | Admitting: Family Medicine

## 2013-03-05 DIAGNOSIS — D509 Iron deficiency anemia, unspecified: Secondary | ICD-10-CM

## 2013-03-05 DIAGNOSIS — E1129 Type 2 diabetes mellitus with other diabetic kidney complication: Secondary | ICD-10-CM

## 2013-03-05 DIAGNOSIS — H409 Unspecified glaucoma: Secondary | ICD-10-CM

## 2013-03-05 DIAGNOSIS — L89309 Pressure ulcer of unspecified buttock, unspecified stage: Secondary | ICD-10-CM

## 2013-03-05 DIAGNOSIS — N189 Chronic kidney disease, unspecified: Secondary | ICD-10-CM

## 2013-03-05 DIAGNOSIS — L8991 Pressure ulcer of unspecified site, stage 1: Secondary | ICD-10-CM

## 2013-03-05 DIAGNOSIS — G20 Parkinson's disease: Secondary | ICD-10-CM

## 2013-03-05 DIAGNOSIS — F0280 Dementia in other diseases classified elsewhere without behavioral disturbance: Secondary | ICD-10-CM

## 2013-03-05 DIAGNOSIS — L89301 Pressure ulcer of unspecified buttock, stage 1: Secondary | ICD-10-CM

## 2013-03-07 ENCOUNTER — Encounter: Payer: Self-pay | Admitting: Family Medicine

## 2013-03-07 NOTE — Assessment & Plan Note (Signed)
No change. Progressive disease state.  Continue Sinemet, though unlikely there will be any clinical reversal of disease

## 2013-03-07 NOTE — Assessment & Plan Note (Signed)
Non-communicative/participatory.  Pt w/ compounding condition of parkinson's No role for pharmacotherapy at this time.  Fortunately pt is still willing to take PO. When or if this were to change, GOC/palliative would need to be discussed w/ family

## 2013-03-07 NOTE — Assessment & Plan Note (Signed)
Agree w/ continued iron supplementation as well as MV

## 2013-03-07 NOTE — Assessment & Plan Note (Signed)
Resolved

## 2013-03-07 NOTE — Assessment & Plan Note (Signed)
Last A1c of 6.8 No current diabetic medical regimen No change at this time Recommend recheck A1c sometime after the new year.

## 2013-03-07 NOTE — Assessment & Plan Note (Signed)
Continue BMET Q monthly Continues to make urine No change in current status

## 2013-03-07 NOTE — Progress Notes (Signed)
Family Medicine Nursing Home Note  [facility] and Living  [name] is  an 79 y.o. female who is bed bound at [facility]. She was in her usual state today. She was lying in bed w/ her arms flexed holding her handgrip supports. She is completely non-communicative and non-participatory. There is concern for a possible Stage II pressure ulcer on her sacrum. No other concerns were elicited from the [facility] staff. I appreciate their care and support along with the [facility] Medicine Geriatric team.    The following portions of the patient's history were reviewed and updated as appropriate: allergies, current medications, past medical history, family and social history, and problem list.  Patient is a nonsmoker.  Health Maintenance: Pt is up to date on pneumonia vaccinations, but is due for flu if not already given.   Past Medical History  Diagnosis Date  . Depression   . Chronic kidney disease   . Hypertension   . Parkinson's disease     5/08 psych consult with Dr. Barbarann Ehlers, transient dfelirium, mood disorder with insomnia. 5/10 CT  scan severe atrophy. Postural tremor.  . Diabetes type 2, controlled   . Anemia   . 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 Peripheral arterial disease   . DJD (degenerative joint disease)   . Glaucoma   . Helicobacter pylori (H. pylori) infection 12 2004     treated   . Trochanteric bursitis of left hip   . Carpal tunnel syndrome     ROS as above otherwise neg.    Medications reviewed. Current Outpatient Prescriptions  Medication Sig Dispense Refill  . acetaminophen (TYLENOL) 325 MG tablet Take 650 mg by mouth 2 (two) times daily. Don't exceed 4000mg  of acetaminophen in 24 hours.      Marland Kitchen aspirin (ADULT ASPIRIN EC LOW STRENGTH) 81 MG EC tablet Take 1 tablet (81 mg total) by mouth daily.      . carbidopa-levodopa (SINEMET) 25-250 MG per tablet Take 1 tablet by mouth 3 (three) times daily. Take prior to meals      . docusate sodium  (COLACE) 100 MG capsule Take 100 mg by mouth daily.      . ergocalciferol (VITAMIN D2) 50000 UNIT capsule Take 50,000 Units by mouth every 30 (thirty) days. Given on 15th of month.      . ferrous sulfate 325 (65 FE) MG tablet Take 1 tablet (325 mg total) by mouth daily with breakfast.  30 tablet  11  . latanoprost (XALATAN) 0.005 % ophthalmic solution Place 1 drop into both eyes daily.      . Multiple Vitamin (MULTIVITAMIN) tablet Take 1 tablet by mouth daily.        . polyethylene glycol (MIRALAX) powder Take 17 g by mouth daily.        . timolol (TIMOPTIC-XE) 0.5 % ophthalmic gel-forming Place 1 drop into both eyes daily.       No current facility-administered medications for this visit.    Exam: There were no vitals taken for this visit. Gen: Well NAD, bed bound HEENT: EOMI,  MMM, mild oral dyskinesia,  Lungs: CTABL Nl WOB Heart: RRR no MRG Abd: NABS, NT, ND Exts: Non edematous BL  LE, warm and well perfused.  MSK: arms restricted to flexed position Skin: no abrasions, sacral region w/o ulceration/tear, well healed scars from previous ulcerations in sacral region and R buttock.   No results found for this or any previous visit (from the past  72 hour(s)).  A/P (as seen in Problem list)  DIABETES MELLITUS, TYPE II, WITH RENAL COMPLICATIONS Last A1c of 6.8 No current diabetic medical regimen No change at this time Recommend recheck A1c sometime after the new year.    PARKINSONISM No change. Progressive disease state.  Continue Sinemet, though unlikely there will be any clinical reversal of disease  Alzheimer's disease Non-communicative/participatory.  Pt w/ compounding condition of parkinson's No role for pharmacotherapy at this time.  Fortunately pt is still willing to take PO. When or if this were to change, GOC/palliative would need to be discussed w/ family  Decubitus ulcer of ischium, stage 1 Resolved  Chronic kidney disease Continue BMET Q monthly Continues to  make urine No change in current status  Iron deficiency anemia Agree w/ continued iron supplementation as well as MV  Glaucoma Despite pts current clinical state, I would recommend continued eyedrops (Xalatan and Timoptic) for Glaucoma. Agree that pt would not benefit from optho evaluation.  No change in therapy

## 2013-03-07 NOTE — Assessment & Plan Note (Signed)
Despite pts current clinical state, I would recommend continued eyedrops (Xalatan and Timoptic) for Glaucoma. Agree that pt would not benefit from optho evaluation.  No change in therapy

## 2013-04-16 ENCOUNTER — Encounter: Payer: Self-pay | Admitting: Pharmacist

## 2013-04-17 ENCOUNTER — Encounter: Payer: Self-pay | Admitting: Family Medicine

## 2013-04-17 ENCOUNTER — Non-Acute Institutional Stay: Payer: PRIVATE HEALTH INSURANCE | Admitting: Family Medicine

## 2013-04-17 DIAGNOSIS — E663 Overweight: Secondary | ICD-10-CM

## 2013-04-17 DIAGNOSIS — E1129 Type 2 diabetes mellitus with other diabetic kidney complication: Secondary | ICD-10-CM

## 2013-04-17 DIAGNOSIS — G20 Parkinson's disease: Secondary | ICD-10-CM

## 2013-04-17 NOTE — Assessment & Plan Note (Signed)
A: patient has lost weight but is stable over past month. Continues to eat. P: monitor weight. No interventions.

## 2013-04-17 NOTE — Assessment & Plan Note (Signed)
A: due for repeat A1c next month. Should be a standing order. P: f/u q 6 month A1c.

## 2013-04-17 NOTE — Progress Notes (Signed)
  Attending Progress Note  Subjective:    [name], female    DOB: 05/09/1928, 80 y.o.   MRN: [id number]  HPI 80 yo F NH resident with advanced Lewy Body dementia with Parkinsonism seen for routine f/u.  Acute events: none.  Nursing concerns: none.    Review of Systems Unable to obtain     Objective:   Physical Exam BP 118/78  Pulse 65  Temp(Src) 96.6 F (35.9 C)  Resp 18  Wt 165 lb 9.6 oz (75.116 kg) Wt Readings from Last 3 Encounters:  03/28/13 165 lb 9.6 oz (75.116 kg)  10/03/12 174 lb 12.8 oz (79.289 kg)  08/11/12 174 lb 9.6 oz (79.198 kg)  General appearance: alert and no distress Lungs: clear to auscultation bilaterally Heart: regular rate and rhythm, S1, S2 normal, no murmur, click, rub or gallop Abdomen: soft, non-tender; bowel sounds normal; no masses,  no organomegaly Extremities: edema none  Neurologic: sitting in wheelchair, eyes open to voice. Non-verbal. Not following commands. Masked facies. Hand contractures (carrots in hands). Small amount of pill rolling.        Assessment & Plan:

## 2013-04-17 NOTE — Assessment & Plan Note (Signed)
A: persistent. Appears a bit worse than previous evaluation with more masked facies.  P:  Continue current medication regimen.

## 2013-05-16 ENCOUNTER — Non-Acute Institutional Stay (INDEPENDENT_AMBULATORY_CARE_PROVIDER_SITE_OTHER): Payer: PRIVATE HEALTH INSURANCE | Admitting: Family Medicine

## 2013-05-16 ENCOUNTER — Encounter: Payer: Self-pay | Admitting: Pharmacist

## 2013-05-16 DIAGNOSIS — G20A1 Parkinson's disease without dyskinesia, without mention of fluctuations: Secondary | ICD-10-CM

## 2013-05-16 DIAGNOSIS — F0280 Dementia in other diseases classified elsewhere without behavioral disturbance: Secondary | ICD-10-CM

## 2013-05-16 DIAGNOSIS — G20 Parkinson's disease: Secondary | ICD-10-CM

## 2013-05-16 DIAGNOSIS — N189 Chronic kidney disease, unspecified: Secondary | ICD-10-CM

## 2013-05-16 DIAGNOSIS — E1129 Type 2 diabetes mellitus with other diabetic kidney complication: Secondary | ICD-10-CM

## 2013-05-16 DIAGNOSIS — G309 Alzheimer's disease, unspecified: Secondary | ICD-10-CM

## 2013-05-16 DIAGNOSIS — E663 Overweight: Secondary | ICD-10-CM

## 2013-05-16 NOTE — Progress Notes (Signed)
Family Medicine Nursing Home Note  [facility] and Living May 16, 2013     [name] is a 80 y.o. female bed bound pt at [facility]. She was in her recumbent wheelchair in the common room area. Her arms were flexed holding her blue bean bags and was non-communicative as usual. This is due to her advanced Lewy Body dementia w/ parkinsonism. There was no apparent distress and there are no concerns from the [facility] staff. Of note previous evaluation of Sacral decubitus sores were unremarkable and there continue to be no further concerns in this area. She continues to need complete assistance w/ dressing, feeding and cleaning herself.   The following portions of the patient's history were reviewed and updated as appropriate: allergies, current medications, past medical history, family and social history, and problem list.  Patient is a nonsmoker.   Past Medical History  Diagnosis Date  . Depression   . Chronic kidney disease   . Hypertension   . Parkinson's disease     5/08 psych consult with Dr. Barbarann Ehlers, transient dfelirium, mood disorder with insomnia. 5/10 CT  scan severe atrophy. Postural tremor.  . Diabetes type 2, controlled   . Anemia   . 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 Peripheral arterial disease   . DJD (degenerative joint disease)   . Glaucoma   . Helicobacter pylori (H. pylori) infection 12 2004     treated   . Trochanteric bursitis of left hip   . Carpal tunnel syndrome     ROS as above otherwise neg.    Medications reviewed. Current Outpatient Prescriptions  Medication Sig Dispense Refill  . acetaminophen (TYLENOL) 325 MG tablet Take 650 mg by mouth 2 (two) times daily. Don't exceed 4000mg  of acetaminophen in 24 hours.      Marland Kitchen aspirin (ADULT ASPIRIN EC LOW STRENGTH) 81 MG EC tablet Take 1 tablet (81 mg total) by mouth daily.      . carbidopa-levodopa (SINEMET) 25-250 MG per tablet Take 1 tablet by mouth 3 (three) times  daily. Take prior to meals      . docusate sodium (COLACE) 100 MG capsule Take 100 mg by mouth daily.      . ergocalciferol (VITAMIN D2) 50000 UNIT capsule Take 50,000 Units by mouth every 30 (thirty) days. Given on 15th of month.      . ferrous sulfate 325 (65 FE) MG tablet Take 325 mg by mouth daily.      Marland Kitchen latanoprost (XALATAN) 0.005 % ophthalmic solution Place 1 drop into both eyes daily.      . Multiple Vitamin (MULTIVITAMIN) tablet Take 1 tablet by mouth daily.        . polyethylene glycol (MIRALAX) powder Take 17 g by mouth daily.        . timolol (TIMOPTIC-XE) 0.5 % ophthalmic gel-forming Place 1 drop into both eyes daily.       No current facility-administered medications for this visit.    Exam: BP 151/86  Pulse 83  Temp(Src) 97.9 F (36.6 C)  Resp 20  Wt 165 lb (74.844 kg) Gen: Well NAD, laying in chair  HEENT: EOMI, PERRL, MMM, mild oral dyskinesia,  Lungs: CTABL Nl WOB  Heart: RRR no MRG  Abd: NABS, NT, ND  Exts: Non edematous BL LE, warm and well perfused.  MSK: arms restricted to flexed position   No results found for this or any previous visit (from the past 72 hour(s)).  A/P (  as seen in Problem list)  DIABETES MELLITUS, TYPE II, WITH RENAL COMPLICATIONS A1c goal below 8. Due for A1c but did not see that it was done yet. Standing order. Will check w/ Geri resident to ensure this is in.  Continue current regimen  Alzheimer's disease No change - likely in end stage. Unfortunate condition Anticipate eventual oral aversion which would likely lead to pts passing Pt is DNR.   PARKINSONISM Continue Sinemet.   Chronic kidney disease Continue monthly BMET for monitoring. Not a dialysis candidate but recommend avoiding renally toxic medications  Overweight (BMI 25.0-29.9) Wt stable at 165lbs over past month.  No change in dietary or DM regimen   Shelly Flattenavid Merrell, MD Family Medicine PGY-3 05/16/2013, 3:52 PM

## 2013-05-16 NOTE — Assessment & Plan Note (Signed)
No change - likely in end stage. Unfortunate condition Anticipate eventual oral aversion which would likely lead to pts passing Pt is DNR.

## 2013-05-16 NOTE — Assessment & Plan Note (Signed)
Continue monthly BMET for monitoring. Not a dialysis candidate but recommend avoiding renally toxic medications

## 2013-05-16 NOTE — Assessment & Plan Note (Signed)
Continue Sinemet ?

## 2013-05-16 NOTE — Assessment & Plan Note (Signed)
A1c goal below 8. Due for A1c but did not see that it was done yet. Standing order. Will check w/ Geri resident to ensure this is in.  Continue current regimen

## 2013-05-16 NOTE — Assessment & Plan Note (Signed)
Wt stable at 165lbs over past month.  No change in dietary or DM regimen

## 2013-06-11 ENCOUNTER — Other Ambulatory Visit: Payer: Self-pay | Admitting: Sports Medicine

## 2013-06-11 MED ORDER — SENNOSIDES-DOCUSATE SODIUM 8.6-50 MG PO TABS: 1.0000 | ORAL_TABLET | Freq: Every day | ORAL | Status: DC

## 2013-06-19 ENCOUNTER — Non-Acute Institutional Stay: Payer: PRIVATE HEALTH INSURANCE | Admitting: Family Medicine

## 2013-06-19 DIAGNOSIS — I10 Essential (primary) hypertension: Secondary | ICD-10-CM

## 2013-06-19 DIAGNOSIS — G20A1 Parkinson's disease without dyskinesia, without mention of fluctuations: Secondary | ICD-10-CM

## 2013-06-19 DIAGNOSIS — G309 Alzheimer's disease, unspecified: Secondary | ICD-10-CM

## 2013-06-19 DIAGNOSIS — D509 Iron deficiency anemia, unspecified: Secondary | ICD-10-CM

## 2013-06-19 DIAGNOSIS — E663 Overweight: Secondary | ICD-10-CM

## 2013-06-19 DIAGNOSIS — E1129 Type 2 diabetes mellitus with other diabetic kidney complication: Secondary | ICD-10-CM

## 2013-06-19 DIAGNOSIS — G20 Parkinson's disease: Secondary | ICD-10-CM

## 2013-06-19 DIAGNOSIS — F0280 Dementia in other diseases classified elsewhere without behavioral disturbance: Secondary | ICD-10-CM

## 2013-06-21 ENCOUNTER — Encounter (HOSPITAL_COMMUNITY): Payer: Self-pay | Admitting: Emergency Medicine

## 2013-06-21 ENCOUNTER — Emergency Department (HOSPITAL_COMMUNITY): Payer: PRIVATE HEALTH INSURANCE

## 2013-06-21 ENCOUNTER — Emergency Department (HOSPITAL_COMMUNITY)
Admission: EM | Admit: 2013-06-21 | Discharge: 2013-06-21 | Disposition: A | Discharge status: Home / Self Care | Payer: PRIVATE HEALTH INSURANCE | Attending: Emergency Medicine | Admitting: Emergency Medicine

## 2013-06-21 DIAGNOSIS — Y9210 Unspecified residential institution as the place of occurrence of the external cause: Secondary | ICD-10-CM | POA: Insufficient documentation

## 2013-06-21 DIAGNOSIS — F0390 Unspecified dementia without behavioral disturbance: Secondary | ICD-10-CM | POA: Insufficient documentation

## 2013-06-21 DIAGNOSIS — G20 Parkinson's disease: Secondary | ICD-10-CM | POA: Insufficient documentation

## 2013-06-21 DIAGNOSIS — Z8669 Personal history of other diseases of the nervous system and sense organs: Secondary | ICD-10-CM | POA: Insufficient documentation

## 2013-06-21 DIAGNOSIS — Z7982 Long term (current) use of aspirin: Secondary | ICD-10-CM | POA: Insufficient documentation

## 2013-06-21 DIAGNOSIS — N189 Chronic kidney disease, unspecified: Secondary | ICD-10-CM | POA: Insufficient documentation

## 2013-06-21 DIAGNOSIS — I129 Hypertensive chronic kidney disease with stage 1 through stage 4 chronic kidney disease, or unspecified chronic kidney disease: Secondary | ICD-10-CM | POA: Insufficient documentation

## 2013-06-21 DIAGNOSIS — S1093XA Contusion of unspecified part of neck, initial encounter: Principal | ICD-10-CM

## 2013-06-21 DIAGNOSIS — F3289 Other specified depressive episodes: Secondary | ICD-10-CM | POA: Insufficient documentation

## 2013-06-21 DIAGNOSIS — G20A1 Parkinson's disease without dyskinesia, without mention of fluctuations: Secondary | ICD-10-CM | POA: Insufficient documentation

## 2013-06-21 DIAGNOSIS — D649 Anemia, unspecified: Secondary | ICD-10-CM | POA: Insufficient documentation

## 2013-06-21 DIAGNOSIS — S0083XA Contusion of other part of head, initial encounter: Secondary | ICD-10-CM

## 2013-06-21 DIAGNOSIS — Z880 Allergy status to penicillin: Secondary | ICD-10-CM | POA: Insufficient documentation

## 2013-06-21 DIAGNOSIS — I739 Peripheral vascular disease, unspecified: Secondary | ICD-10-CM | POA: Insufficient documentation

## 2013-06-21 DIAGNOSIS — Z8619 Personal history of other infectious and parasitic diseases: Secondary | ICD-10-CM | POA: Insufficient documentation

## 2013-06-21 DIAGNOSIS — W06XXXA Fall from bed, initial encounter: Secondary | ICD-10-CM | POA: Insufficient documentation

## 2013-06-21 DIAGNOSIS — Z9889 Other specified postprocedural states: Secondary | ICD-10-CM | POA: Insufficient documentation

## 2013-06-21 DIAGNOSIS — S0003XA Contusion of scalp, initial encounter: Secondary | ICD-10-CM | POA: Insufficient documentation

## 2013-06-21 DIAGNOSIS — Y9389 Activity, other specified: Secondary | ICD-10-CM | POA: Insufficient documentation

## 2013-06-21 DIAGNOSIS — F329 Major depressive disorder, single episode, unspecified: Secondary | ICD-10-CM | POA: Insufficient documentation

## 2013-06-21 DIAGNOSIS — Z950 Presence of cardiac pacemaker: Secondary | ICD-10-CM | POA: Insufficient documentation

## 2013-06-21 DIAGNOSIS — W1809XA Striking against other object with subsequent fall, initial encounter: Secondary | ICD-10-CM | POA: Insufficient documentation

## 2013-06-21 NOTE — Assessment & Plan Note (Signed)
Hemoglobin stable with last hemoglobin of 12.7 on 06/18/2013. Continue iron supplementation.

## 2013-06-21 NOTE — Assessment & Plan Note (Addendum)
End-stage dementia. Continue supportive therapy. Patient is beginning to show difficulty with swallowing, continue thickened liquids to help prevent aspiration, monitor weight  Recent unresponsive episode with unclear etiology, infectious workup negative, physical exam did not identify any infectious etiology, likely component of end-stage dementia, patient is unable to participate in neurologic examination however without any new acute focal symptoms TIA/CVA is unlikely, continue current therapy

## 2013-06-21 NOTE — ED Notes (Signed)
Family (Granddaughter), updated on patient status and plan of care.

## 2013-06-21 NOTE — Assessment & Plan Note (Signed)
Weight has been stable. Patient has been having worsening swallow function. Patient currently taking thickened liquids. Continue to monitor by mouth intake

## 2013-06-21 NOTE — Progress Notes (Signed)
   Subjective:    [name], female    DOB: 08/02/1928, 80 y.o.   MRN: [id number]  HPI 80 year old female with past medical history of Louise body dementia, Alzheimer's disease, immobility, chronic kidney disease, and glaucoma who is seen and evaluated at [facility].  In discussion with the nursing staff and the [facility] nurse, the patient had an unresponsive episode on 06/16/2013, the episode only lasted a few minutes, per the patient's family members she has had similar episodes in the past, the patient immediately returned back to her baseline,she is nonverbal and immobile at baseline however she has been eating and drinking normally since that time, no acute signs or symptoms of infection were identified, lab work was obtained and as outlined in the objective section  To further history of present illness is able to be obtained from the patient due to her baseline nonverbal status  I discussed the patient's sacral wound with the wound care nurse who states that they are healing well in no acute issues are identified  Review of Systems Unable to be obtained due to severe dementia.    Objective:   Physical Exam Vitals: Reviewed General: African American female, sitting in wheelchair, she is nonverbal, she opens her eyes to stimuli Cardiac: Regular in rhythm, S1 and S2 present, no murmurs, no heaves or thrills Respiratory: Clear to patient bilaterally, normal effort  Abdomen: Soft, nontender, normal bowel sounds Extremities: No edema, 2+ radial pulses bilaterally Neuro: Patient is not oriented to person place or time. Bilateral upper extremities and hands in clenched position, masked facies  Laboratory obtained on 06/18/2012: UA was turbid but otherwise unremarkable, urine culture grew 20,000 strep viridans, basic metabolic panel-sodium 141, potassium 4.1, chloride of 107, bicarbonate of 24, BUN 19, creatinine 1.02, glucose 96; CBC-WBC of 6.6, hemoglobin of 12.7,  hematocrit 38.0, platelets 193, MCV 67   Assessment & Plan:  Please see problem specific assessment and plan.

## 2013-06-21 NOTE — Assessment & Plan Note (Signed)
Controlled based on most recent A1c of 6.8 in August of 2014. Glucose less than 100 on most recent BMP performed on 06/18/2013 -No change to current therapy

## 2013-06-21 NOTE — ED Provider Notes (Signed)
CSN: [id number]     Arrival date & time 06/21/13  1748 History   First MD Initiated Contact with Patient 06/21/13 1836     Chief Complaint  Patient presents with  . Fall      HPI  Patient presents from her extended care facility. She felt a 1 foot onto the floor. She is a contusion to her left forehead. 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 Brother here to be evaluated. She feels she is at her baseline. At her baseline she has contractures and is nonverbal.  Past Medical History  Diagnosis Date  . Depression   . Chronic kidney disease   . Hypertension   . Parkinson's disease     5/08 psych consult with Dr. Barbarann EhlersWillford, transient dfelirium, mood disorder with insomnia. 5/10 CT  scan severe atrophy. Postural tremor.  . Diabetes type 2, controlled   . Anemia   . 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 Peripheral arterial disease   . DJD (degenerative joint disease)   . Glaucoma   . Helicobacter pylori (H. pylori) infection 12 2004     treated   . Trochanteric bursitis of left hip   . Carpal tunnel syndrome    Past Surgical History  Procedure Laterality Date  . Appendectomy  1944  . Tubal ligation    . Vaginal hysterectomy      For heavy bleeding   . Cataract extraction, bilateral  1990  . Cardaic cath    . Cardiac catheterization  03/2003    normal  . [facility]   . Oophorectomy  07/2003    for mucinous cyst adenoma   . Trabeculectomy  07/2006   Family History  Problem Relation Age of Onset  . Breast cancer Daughter [id number]    Deceased   . Colon cancer Daughter [id number]  . Diabetes Sister   . Diabetes Father   . Hypertension Mother   . Alzheimer's disease Mother    History  Substance Use Topics  . Smoking status: Never Smoker   . Smokeless tobacco: Former NeurosurgeonUser    Types: Snuff  . Alcohol Use: No   OB History   Grav Para Term Preterm Abortions TAB SAB Ect Mult Living                 Review of Systems  Unable to perform  ROS: Dementia  Constitutional: Negative for fever, chills, diaphoresis, appetite change and fatigue.  HENT: Negative for mouth sores, sore throat and trouble swallowing.   Eyes: Negative for visual disturbance.  Respiratory: Negative for cough, chest tightness, shortness of breath and wheezing.   Cardiovascular: Negative for chest pain.  Gastrointestinal: Negative for nausea, vomiting, abdominal pain, diarrhea and abdominal distention.  Endocrine: Negative for polydipsia, polyphagia and polyuria.  Genitourinary: Negative for dysuria, frequency and hematuria.  Musculoskeletal: Negative for gait problem.  Skin: Negative for color change, pallor and rash.  Neurological: Negative for dizziness, syncope, light-headedness and headaches.  Hematological: Does not bruise/bleed easily.  Psychiatric/Behavioral: Negative for behavioral problems and confusion.      Allergies  Megace; Amlodipine besylate; Donepezil hydrochloride; and Penicillins  Home Medications   Current Outpatient Rx  Name  Route  Sig  Dispense  Refill  . acetaminophen (TYLENOL) 325 MG tablet   Oral   Take 650 mg by mouth 2 (two) times daily. Don't exceed 4000mg  of acetaminophen in 24 hours.         .Marland Kitchen  aspirin (ADULT ASPIRIN EC LOW STRENGTH) 81 MG EC tablet   Oral   Take 1 tablet (81 mg total) by mouth daily.         . carbidopa-levodopa (SINEMET) 25-250 MG per tablet   Oral   Take 1 tablet by mouth 3 (three) times daily. Take prior to meals         . ergocalciferol (VITAMIN D2) 50000 UNIT capsule   Oral   Take 50,000 Units by mouth every 30 (thirty) days. Given on 15th of month.         . ferrous sulfate 325 (65 FE) MG tablet   Oral   Take 325 mg by mouth daily.         Marland Kitchen latanoprost (XALATAN) 0.005 % ophthalmic solution   Both Eyes   Place 1 drop into both eyes daily.         . Multiple Vitamin (MULTIVITAMIN) tablet   Oral   Take 1 tablet by mouth daily.           Marland Kitchen senna-docusate (SENOKOT-S)  8.6-50 MG per tablet   Oral   Take 1 tablet by mouth daily.         . timolol (TIMOPTIC-XE) 0.5 % ophthalmic gel-forming   Both Eyes   Place 1 drop into both eyes daily.          BP 129/82  Pulse 64  Resp 18  SpO2 99% Physical Exam  Constitutional: She is oriented to person, place, and time. She appears well-developed and well-nourished. No distress.  HENT:  Head: Normocephalic.    Blood around the TMs, mastoids, or from ears nose or mouth. Has a left forehead hematoma.  Eyes: Conjunctivae are normal. Pupils are equal, round, and reactive to light. No scleral icterus.  Neck: Normal range of motion. Neck supple. No thyromegaly present.  Cardiovascular: Normal rate and regular rhythm.  Exam reveals no gallop and no friction rub.   No murmur heard. Pulmonary/Chest: Effort normal and breath sounds normal. No respiratory distress. She has no wheezes. She has no rales.  Abdominal: Soft. Bowel sounds are normal. She exhibits no distension. There is no tenderness. There is no rebound.  Musculoskeletal: Normal range of motion.  Neurological: She is alert and oriented to person, place, and time.  Forehead contusion. Contractures all 4 extremities.  Skin: Skin is warm and dry. No rash noted.  Psychiatric: She has a normal mood and affect. Her behavior is normal.    ED Course  Procedures (including critical care time) Labs Review Labs Reviewed - No data to display Imaging Review Ct Head Wo Contrast  06/21/2013   CLINICAL DATA:  Unwitnessed fall, forehead injury  EXAM: CT HEAD WITHOUT CONTRAST  TECHNIQUE: Contiguous axial images were obtained from the base of the skull through the vertex without intravenous contrast.  COMPARISON:  08/23/2008  FINDINGS: No evidence of parenchymal hemorrhage or extra-axial fluid collection. No mass lesion, mass effect, or midline shift.  No CT evidence of acute infarction.  Subcortical white matter and periventricular small vessel ischemic changes.  Intracranial atherosclerosis.  Moderate to severe cortical and central atrophy with secondary ventriculomegaly, increased from 2010.  The visualized paranasal sinuses are essentially clear. The mastoid air cells are unopacified.  No evidence of calvarial fracture.  IMPRESSION: No evidence of acute intracranial abnormality.  Small vessel ischemic changes with intracranial atherosclerosis.  Moderate to severe atrophy with secondary ventriculomegaly, increased from 2010.   Electronically Signed   By: Roselie Awkward.D.  On: 06/21/2013 19:43     EKG Interpretation None      MDM   Final diagnoses:  Forehead contusion    Normal CT scan. She'll be discharged after a care facility.    Rolland Porter, MD 06/21/13 2008

## 2013-06-21 NOTE — Assessment & Plan Note (Signed)
Mildly elevated however within acceptable range. Continue to monitor.

## 2013-06-21 NOTE — ED Notes (Signed)
[facility]

## 2013-06-21 NOTE — ED Notes (Signed)
Per EMS, [facility], pt had unwitnessed fell from bed and hit forehead x4 hours ago, EMS states approx 8 inch fall. No bruising or hematoma noted. Pt. At baseline per nursing home staff.

## 2013-06-21 NOTE — Discharge Instructions (Signed)
Hematoma A hematoma is a collection of blood. The collection of blood can turn into a hard, painful lump under the skin. Your skin may turn blue or yellow if the hematoma is close to the surface of the skin. Most hematomas get better in a few days to weeks. Some hematomas are serious and need medical care. Hematomas can be very small or very big. HOME CARE  Apply ice to the injured area:  Put ice in a plastic bag.  Place a towel between your skin and the bag.  Leave the ice on for 20 minutes, 2 3 times a day for the first 1 to 2 days.  After the first 2 days, switch to using warm packs on the injured area.  Raise (elevate) the injured area to lessen pain and puffiness (swelling). You may also wrap the area with an elastic bandage. Make sure the bandage is not wrapped too tight.  If you have a painful hematoma on your leg or foot, you may use crutches for a couple days.  Only take medicines as told by your doctor. GET HELP RIGHT AWAY IF:   Your pain gets worse.  Your pain is not controlled with medicine.  You have a fever.  Your puffiness gets worse.  Your skin turns more blue or yellow.  Your skin over the hematoma breaks or starts bleeding.  Your hematoma is in your chest or belly (abdomen) and you are short of breath, feel weak, or have a change in consciousness.  Your hematoma is on your scalp and you have a headache that gets worse or a change in alertness or consciousness. MAKE SURE YOU:   Understand these instructions.  Will watch your condition.  Will get help right away if you are not doing well or get worse. Document Released: 04/21/2004 Document Revised: 11/14/2012 Document Reviewed: 08/22/2012 [facility] Patient Information 2014 [employer] [place].  Hematoma A hematoma is a collection of blood. The collection of blood can turn into a hard, painful lump under the skin. Your skin may turn blue or yellow if the hematoma is close to the surface of the skin. Most  hematomas get better in a few days to weeks. Some hematomas are serious and need medical care. Hematomas can be very small or very big. HOME CARE  Apply ice to the injured area:  Put ice in a plastic bag.  Place a towel between your skin and the bag.  Leave the ice on for 20 minutes, 2 3 times a day for the first 1 to 2 days.  After the first 2 days, switch to using warm packs on the injured area.  Raise (elevate) the injured area to lessen pain and puffiness (swelling). You may also wrap the area with an elastic bandage. Make sure the bandage is not wrapped too tight.  If you have a painful hematoma on your leg or foot, you may use crutches for a couple days.  Only take medicines as told by your doctor. GET HELP RIGHT AWAY IF:   Your pain gets worse.  Your pain is not controlled with medicine.  You have a fever.  Your puffiness gets worse.  Your skin turns more blue or yellow.  Your skin over the hematoma breaks or starts bleeding.  Your hematoma is in your chest or belly (abdomen) and you are short of breath, feel weak, or have a change in consciousness.  Your hematoma is on your scalp and you have a headache that gets worse or a  change in alertness or consciousness. MAKE SURE YOU:   Understand these instructions.  Will watch your condition.  Will get help right away if you are not doing well or get worse. Document Released: 04/21/2004 Document Revised: 11/14/2012 Document Reviewed: 08/22/2012 [facility] Patient Information 2014 [place], [place].

## 2013-06-21 NOTE — Assessment & Plan Note (Signed)
Stable on Sinemet.

## 2013-06-24 ENCOUNTER — Encounter: Payer: Self-pay | Admitting: Pharmacist

## 2013-07-16 ENCOUNTER — Encounter: Payer: Self-pay | Admitting: Family Medicine

## 2013-07-16 ENCOUNTER — Non-Acute Institutional Stay (INDEPENDENT_AMBULATORY_CARE_PROVIDER_SITE_OTHER): Payer: PRIVATE HEALTH INSURANCE | Admitting: Family Medicine

## 2013-07-16 DIAGNOSIS — E663 Overweight: Secondary | ICD-10-CM

## 2013-07-16 DIAGNOSIS — I10 Essential (primary) hypertension: Secondary | ICD-10-CM

## 2013-07-16 DIAGNOSIS — D509 Iron deficiency anemia, unspecified: Secondary | ICD-10-CM

## 2013-07-16 DIAGNOSIS — E1129 Type 2 diabetes mellitus with other diabetic kidney complication: Secondary | ICD-10-CM

## 2013-07-16 DIAGNOSIS — G20 Parkinson's disease: Secondary | ICD-10-CM

## 2013-07-16 NOTE — Assessment & Plan Note (Signed)
Pt not weighed in past mo Discussed w/ staff that this should happen monthly for pt Pt to be weighed and recorded.  Pt has been fairly stable in the past.

## 2013-07-16 NOTE — Assessment & Plan Note (Signed)
Cont Ferrous Sulfate. Q1361mo CBC

## 2013-07-16 NOTE — Progress Notes (Unsigned)
Family Medicine Nursing Home Note  [facility]  07/16/13   [name] is a 80 y.o. female [facility] ***   The following portions of the patient's history were reviewed and updated as appropriate: allergies, current medications, past medical history, family and social history, and problem list.  Patient is a nonsmoker ***  Health Maintenance: ***  Past Medical History  Diagnosis Date  . Depression   . Chronic kidney disease   . Hypertension   . Parkinson's disease     5/08 psych consult with Dr. Barbarann EhlersWillford, transient dfelirium, mood disorder with insomnia. 5/10 CT  scan severe atrophy. Postural tremor.  . Diabetes type 2, controlled   . Anemia   . 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 Peripheral arterial disease   . DJD (degenerative joint disease)   . Glaucoma   . Helicobacter pylori (H. pylori) infection 12 2004     treated   . Trochanteric bursitis of left hip   . Carpal tunnel syndrome     ROS as above otherwise neg.    Medications reviewed. Current Outpatient Prescriptions  Medication Sig Dispense Refill  . acetaminophen (TYLENOL) 325 MG tablet Take 650 mg by mouth 2 (two) times daily. Don't exceed 4000mg  of acetaminophen in 24 hours.      Marland Kitchen. aspirin (ADULT ASPIRIN EC LOW STRENGTH) 81 MG EC tablet Take 1 tablet (81 mg total) by mouth daily.      . carbidopa-levodopa (SINEMET) 25-250 MG per tablet Take 1 tablet by mouth 3 (three) times daily. Take prior to meals      . docusate sodium (COLACE) 100 MG capsule Take 100 mg by mouth daily.      . ergocalciferol (VITAMIN D2) 50000 UNIT capsule Take 50,000 Units by mouth every 30 (thirty) days. Given on 15th of month.      . ferrous sulfate 325 (65 FE) MG tablet Take 325 mg by mouth daily.      Marland Kitchen. latanoprost (XALATAN) 0.005 % ophthalmic solution Place 1 drop into both eyes daily.      . Multiple Vitamin (MULTIVITAMIN) tablet Take 1 tablet by mouth daily.        Marland Kitchen. senna  (SENOKOT) 8.6 MG TABS tablet Take 1 tablet by mouth every Monday, Wednesday, and Friday.      . timolol (TIMOPTIC-XE) 0.5 % ophthalmic gel-forming Place 1 drop into both eyes daily.       No current facility-administered medications for this visit.    Exam: *** BP 130/65  Pulse 67  Resp 19 Gen: Well NAD HEENT: EOMI,  MMM Lungs: CTABL Nl WOB Heart: RRR no MRG Abd: NABS, NT, ND Exts: Non edematous BL  LE, warm and well perfused.   No results found for this or any previous visit (from the past 72 hour(s)).  A/P (as seen in Problem list)  Overweight (BMI 25.0-29.9) May need to limit calories to avoid problems of obesity.    '

## 2013-07-16 NOTE — Assessment & Plan Note (Signed)
BP at goal.  No need to restart medications

## 2013-07-16 NOTE — Assessment & Plan Note (Signed)
Cont Sinemet 

## 2013-07-16 NOTE — Progress Notes (Signed)
Family Medicine Nursing Home Note  [facility] 07/16/13   [name] is a 80 y.o. female bed bound [facility]. She was in her recumbent wheelchair in the common room area. Her arms were flexed holding her blue bean bags and was non-communicative as usual. This is due to her advanced Lewy Body dementia w/ parkinsonism. There was no apparent distress and there are no concerns from the [facility]. She continues to need complete assistance w/ dressing, feeding and cleaning herself.   The following portions of the patient's history were reviewed and updated as appropriate: allergies, current medications, past medical history, family and social history, and problem list.  Patient is a nonsmoker.  Past Medical History  Diagnosis Date  . Depression   . Chronic kidney disease   . Hypertension   . Parkinson's disease     5/08 psych consult with Dr. Barbarann EhlersWillford, transient dfelirium, mood disorder with insomnia. 5/10 CT  scan severe atrophy. Postural tremor.  . Diabetes type 2, controlled   . Anemia   . 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 Peripheral arterial disease   . DJD (degenerative joint disease)   . Glaucoma   . Helicobacter pylori (H. pylori) infection 12 2004     treated   . Trochanteric bursitis of left hip   . Carpal tunnel syndrome     ROS as above otherwise neg.    Medications reviewed. Current Outpatient Prescriptions  Medication Sig Dispense Refill  . acetaminophen (TYLENOL) 325 MG tablet Take 650 mg by mouth 2 (two) times daily. Don't exceed 4000mg  of acetaminophen in 24 hours.      Marland Kitchen. aspirin (ADULT ASPIRIN EC LOW STRENGTH) 81 MG EC tablet Take 1 tablet (81 mg total) by mouth daily.      . carbidopa-levodopa (SINEMET) 25-250 MG per tablet Take 1 tablet by mouth 3 (three) times daily. Take prior to meals      . docusate sodium (COLACE) 100 MG capsule Take 100 mg by mouth daily.      . ergocalciferol (VITAMIN D2) 50000 UNIT  capsule Take 50,000 Units by mouth every 30 (thirty) days. Given on 15th of month.      . ferrous sulfate 325 (65 FE) MG tablet Take 325 mg by mouth daily.      Marland Kitchen. latanoprost (XALATAN) 0.005 % ophthalmic solution Place 1 drop into both eyes daily.      . Multiple Vitamin (MULTIVITAMIN) tablet Take 1 tablet by mouth daily.        Marland Kitchen. senna (SENOKOT) 8.6 MG TABS tablet Take 1 tablet by mouth every Monday, Wednesday, and Friday.      . timolol (TIMOPTIC-XE) 0.5 % ophthalmic gel-forming Place 1 drop into both eyes daily.       No current facility-administered medications for this visit.    Exam: BP 130/65  Pulse 67  Temp(Src) 97.6 F (36.4 C)  Resp 19 Gen: Well NAD, laying in recumbent chair HEENT: EOMI,  MMM, no oral dyskinesia Lungs: CTABL Nl WOB Heart: RRR no MRG Abd: NABS, NT, ND Exts: Non edematous BL  LE, warm and well perfused.  MSK: arms restricted to flexed position   No results found for this or any previous visit (from the past 72 hour(s)).  A/P (as seen in Problem list)  HYPERTENSION, BENIGN BP at goal.  No need to restart medications  DIABETES MELLITUS, TYPE II, WITH RENAL COMPLICATIONS A1c goal below 8/8.5.  Did  not see recent in chart.  Wt stable. Pt is supposed to have standing order for Q418mo A1c.  Will f/u Geri Resident  PARKINSONISM Cont Sinemet   Iron deficiency anemia Cont Ferrous Sulfate. Q6242mo CBC  Overweight (BMI 25.0-29.9) Pt not weighed in past mo Discussed w/ staff that this should happen monthly for pt Pt to be weighed and recorded.  Pt has been fairly stable in the past.

## 2013-07-16 NOTE — Assessment & Plan Note (Signed)
A1c goal below 8/8.5.  Did not see recent in chart.  Wt stable. Pt is supposed to have standing order for Q5566mo A1c.  Will f/u Geri Resident

## 2013-07-16 NOTE — Progress Notes (Deleted)
   Subjective:    [name], female    DOB: 11/30/1928, 80 y.o.   MRN: [id number]  HPI    Review of Systems     Objective:   Physical Exam        Assessment & Plan:

## 2013-07-19 ENCOUNTER — Encounter: Payer: Self-pay | Admitting: Family Medicine

## 2013-07-19 LAB — HEMOGLOBIN A1C: Hgb A1c MFr Bld: 7.1 % — AB (ref 4.0–6.0)

## 2013-08-16 ENCOUNTER — Encounter: Payer: Self-pay | Admitting: Pharmacist

## 2013-08-21 ENCOUNTER — Non-Acute Institutional Stay: Payer: PRIVATE HEALTH INSURANCE | Admitting: Family Medicine

## 2013-08-21 DIAGNOSIS — L89152 Pressure ulcer of sacral region, stage 2: Secondary | ICD-10-CM | POA: Insufficient documentation

## 2013-08-21 DIAGNOSIS — G20 Parkinson's disease: Secondary | ICD-10-CM

## 2013-08-21 DIAGNOSIS — L89109 Pressure ulcer of unspecified part of back, unspecified stage: Secondary | ICD-10-CM

## 2013-08-21 DIAGNOSIS — E1129 Type 2 diabetes mellitus with other diabetic kidney complication: Secondary | ICD-10-CM

## 2013-08-21 DIAGNOSIS — L8991 Pressure ulcer of unspecified site, stage 1: Secondary | ICD-10-CM

## 2013-08-21 DIAGNOSIS — N189 Chronic kidney disease, unspecified: Secondary | ICD-10-CM

## 2013-08-21 DIAGNOSIS — L89151 Pressure ulcer of sacral region, stage 1: Secondary | ICD-10-CM

## 2013-08-21 MED ORDER — CARBIDOPA-LEVODOPA 25-100 MG PO TABS: 1.0000 | ORAL_TABLET | Freq: Three times a day (TID) | ORAL | Status: DC

## 2013-08-21 NOTE — Progress Notes (Signed)
   Subjective:    [name], female    DOB: Nov 23, 1928, 80 y.o.   MRN: [id number]  HPI 80 y/o female with PMH Alzheimer's dementia, Parkinsonism, CKD seen and evaluated at [facility].  Patient is severely demented and is not able to communicate.  Spoke to [name] who is the patient's granddaughter and head of nurses at [place]. No acute issues other than some increased movements of her mouth. [name] does have a small sacral ulcer that is a stage I. She has a dressing over this area.    Review of Systems Unable to obtain due to patient's severe dementia.     Objective:   Physical Exam Vitals: reviewed Gen: AAF, sitting in wheelchair HEENT: normocephalic, movement of the patient's mouth consistent with possible tardive dyskinesia Cardiac: RRR, S1 and S2 present, no murmurs, no heaves/thrills Resp: CTAB, normal effort Abd: soft, no tenderness Neuro: patient has chronic contractures of upper extremities Ext: no edema     Assessment & Plan:  Please see problem specific assessment and plan.

## 2013-08-21 NOTE — Assessment & Plan Note (Signed)
Patient has increased movement of her mouth. May be consistent with overmedication with Sinemet.  -Decrease dose to 25-100 TID

## 2013-08-21 NOTE — Assessment & Plan Note (Addendum)
A1C 7.5 on 07/18/13 -diet controlled, no change in therapy

## 2013-08-21 NOTE — Assessment & Plan Note (Signed)
Stage 3 based on GFR. Appears stable. -continue to monitor

## 2013-08-21 NOTE — Assessment & Plan Note (Signed)
Stable per nursing staff -continue local wound care

## 2013-09-23 ENCOUNTER — Non-Acute Institutional Stay (INDEPENDENT_AMBULATORY_CARE_PROVIDER_SITE_OTHER): Payer: PRIVATE HEALTH INSURANCE | Admitting: Family Medicine

## 2013-09-23 DIAGNOSIS — M6289 Other specified disorders of muscle: Secondary | ICD-10-CM

## 2013-09-23 DIAGNOSIS — E663 Overweight: Secondary | ICD-10-CM

## 2013-09-23 DIAGNOSIS — L8991 Pressure ulcer of unspecified site, stage 1: Secondary | ICD-10-CM

## 2013-09-23 DIAGNOSIS — L89151 Pressure ulcer of sacral region, stage 1: Secondary | ICD-10-CM

## 2013-09-23 DIAGNOSIS — M623 Immobility syndrome (paraplegic): Secondary | ICD-10-CM

## 2013-09-23 DIAGNOSIS — E1129 Type 2 diabetes mellitus with other diabetic kidney complication: Secondary | ICD-10-CM

## 2013-09-23 DIAGNOSIS — G20 Parkinson's disease: Secondary | ICD-10-CM

## 2013-09-23 DIAGNOSIS — L89109 Pressure ulcer of unspecified part of back, unspecified stage: Secondary | ICD-10-CM

## 2013-09-23 NOTE — Progress Notes (Signed)
Subjective:   [name], female DOB: 06/08/1928, 80 y.o. MRN: [id number]    HPI  80 y/o female with PMH Lewy body dementia, Parkinsonism, CKD [facility]. Patient is severely demented and is not able to communicate.   Pt was seen in her usual state, lying in bed w/ arms and hands in flexed position. Non-communicative but in no apparent distress. No oral diakinesis noted on exam today. No acute issues per the nursing staff.   The following portions of the patient's history were reviewed and updated as appropriate: allergies, current medications, past medical history, family and social history, and problem list.  Patient is a nonsmoker  Past Medical History  Diagnosis Date  . Depression   . Chronic kidney disease   . Hypertension   . Parkinson's disease     5/08 psych consult with Dr. Barbarann EhlersWillford, transient dfelirium, mood disorder with insomnia. 5/10 CT  scan severe atrophy. Postural tremor.  . Diabetes type 2, controlled   . Anemia   . 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 Peripheral arterial disease   . DJD (degenerative joint disease)   . Glaucoma   . Helicobacter pylori (H. pylori) infection 12 2004     treated   . Trochanteric bursitis of left hip   . Carpal tunnel syndrome     ROS as above otherwise neg.    Medications reviewed. Current Outpatient Prescriptions  Medication Sig Dispense Refill  . acetaminophen (TYLENOL) 325 MG tablet Take 650 mg by mouth 2 (two) times daily. Don't exceed 4000mg  of acetaminophen in 24 hours.      Marland Kitchen. aspirin (ADULT ASPIRIN EC LOW STRENGTH) 81 MG EC tablet Take 1 tablet (81 mg total) by mouth daily.      . carbidopa-levodopa (SINEMET IR) 25-100 MG per tablet Take 1 tablet by mouth 3 (three) times daily.  90 tablet  0  . docusate sodium (COLACE) 100 MG capsule Take 100 mg by mouth daily.      . ergocalciferol (VITAMIN D2) 50000 UNIT capsule Take 50,000 Units by mouth every 30 (thirty) days.  Given on 15th of month.      . ferrous sulfate 325 (65 FE) MG tablet Take 325 mg by mouth daily.      Marland Kitchen. latanoprost (XALATAN) 0.005 % ophthalmic solution Place 1 drop into both eyes daily.      . Multiple Vitamin (MULTIVITAMIN) tablet Take 1 tablet by mouth daily.        Marland Kitchen. senna (SENOKOT) 8.6 MG TABS tablet Take 1 tablet by mouth every Monday, Wednesday, and Friday.      . timolol (TIMOPTIC-XE) 0.5 % ophthalmic gel-forming Place 1 drop into both eyes daily.      Marland Kitchen. ZINC OXIDE EX Apply 1 application topically 3 (three) times daily. To R inner buttocks       No current facility-administered medications for this visit.    Exam: [facility] and stable including weight.  Gen: Well NAD, laying in bed HEENT: EOMI, MMM, no oral dyskinesia  Lungs: CTABL Nl WOB  Heart: RRR no MRG  Abd: NABS, NT, ND  Exts: Non edematous BL LE, warm and well perfused.  MSK: arms restricted to flexed position   No results found for this or any previous visit (from the past 72 hour(s)).  A/P (as seen in Problem list)  DIABETES MELLITUS, TYPE II, WITH RENAL COMPLICATIONS Controlled on diet alone.  Pt wt stable.  Last A1c of 7.1 No further mgt at this time  A1c check Q7037mo  Immobility syndrome Restricted movement w/ contractures.  No change  Cont in house PT as able.   Stage I pressure ulcer of sacral region Resolved but likely to return from time to time.   Overweight (BMI 25.0-29.9) Stable.  No concerns a tthis time Continue to monitor  PARKINSONISM No change in overall condition since decreasing sinemet.  No further changes.

## 2013-09-24 NOTE — Assessment & Plan Note (Signed)
Stable.  No concerns a tthis time Continue to monitor

## 2013-09-24 NOTE — Assessment & Plan Note (Signed)
Resolved but likely to return from time to time.

## 2013-09-24 NOTE — Assessment & Plan Note (Signed)
Controlled on diet alone.  Pt wt stable.  Last A1c of 7.1 No further mgt at this time  A1c check Q818mo

## 2013-09-24 NOTE — Assessment & Plan Note (Signed)
No change in overall condition since decreasing sinemet.  No further changes.

## 2013-09-24 NOTE — Assessment & Plan Note (Signed)
Restricted movement w/ contractures.  No change  Cont in house PT as able.

## 2013-10-17 ENCOUNTER — Encounter: Payer: Self-pay | Admitting: Family Medicine

## 2013-10-17 ENCOUNTER — Non-Acute Institutional Stay: Payer: PRIVATE HEALTH INSURANCE | Admitting: Family Medicine

## 2013-10-17 DIAGNOSIS — M6289 Other specified disorders of muscle: Secondary | ICD-10-CM

## 2013-10-17 DIAGNOSIS — I10 Essential (primary) hypertension: Secondary | ICD-10-CM

## 2013-10-17 DIAGNOSIS — D509 Iron deficiency anemia, unspecified: Secondary | ICD-10-CM

## 2013-10-17 DIAGNOSIS — H409 Unspecified glaucoma: Secondary | ICD-10-CM

## 2013-10-17 DIAGNOSIS — R634 Abnormal weight loss: Secondary | ICD-10-CM

## 2013-10-17 DIAGNOSIS — M623 Immobility syndrome (paraplegic): Secondary | ICD-10-CM

## 2013-10-17 DIAGNOSIS — E1129 Type 2 diabetes mellitus with other diabetic kidney complication: Secondary | ICD-10-CM

## 2013-10-17 NOTE — Assessment & Plan Note (Signed)
Patient not on medication. Appears to be controlled without medication. Will reassess at next visit, but problem may be resolved. Will continue to monitor blood pressure.

## 2013-10-17 NOTE — Assessment & Plan Note (Signed)
Down 10lbs since February. Will continue to monitor, but may need nutrition supplementation if continues to lose weight.

## 2013-10-17 NOTE — Assessment & Plan Note (Signed)
Continue current management. Currently on ferrous sulfate 325mg 

## 2013-10-17 NOTE — Assessment & Plan Note (Signed)
No change to presentation.

## 2013-10-17 NOTE — Assessment & Plan Note (Signed)
Continue Xalantan and Timolol

## 2013-10-17 NOTE — Assessment & Plan Note (Signed)
Controlled on diet. No change to current management.

## 2013-10-17 NOTE — Progress Notes (Signed)
   Subjective:    [name], female    DOB: 07/08/1928, 80 y.o.   MRN: [id number]  HPI  Patient with history of dementia, diabetes mellitus type 2, hypertension, constipation, glaucoma and Parkinsonism. Patient not responsive to questions. Per granddaughter, patient has had more mouth movements which she believes is related remembering past usage of snuff. Otherwise, she feels [name] has been doing well.   Past Medical History  Diagnosis Date  . Depression   . Chronic kidney disease   . Hypertension   . Parkinson's disease     5/08 psych consult with Dr. Barbarann EhlersWillford, transient dfelirium, mood disorder with insomnia. 5/10 CT  scan severe atrophy. Postural tremor.  . Diabetes type 2, controlled   . Anemia   . 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 Peripheral arterial disease   . DJD (degenerative joint disease)   . Glaucoma   . Helicobacter pylori (H. pylori) infection 12 2004     treated   . Trochanteric bursitis of left hip   . Carpal tunnel syndrome    History  Substance Use Topics  . Smoking status: Never Smoker   . Smokeless tobacco: Former NeurosurgeonUser    Types: Snuff  . Alcohol Use: No   Allergies  Allergen Reactions  . Megace [Megestrol]     Adrenal insufficiency  . Amlodipine Besylate     REACTION: dizziness  . Donepezil Hydrochloride     REACTION: frequent urination  . Penicillins     REACTION: swelling    Review of Systems  Unable to perform ROS: Dementia       Objective:   Physical Exam  Eyes:  Patient would not allow me to evaluate her eyes. She would forcefully close her eyes with application of a light and with attempts to open her eyes manually.  Cardiovascular: Normal rate, regular rhythm and normal heart sounds.   Pulmonary/Chest: Effort normal and breath sounds normal. No respiratory distress. She has no wheezes.  Abdominal: Soft. Bowel sounds are normal. She exhibits no distension.  Musculoskeletal:  Bilateral  hand contractures  Neurological: She is unresponsive. She is not disoriented.          Assessment & Plan:

## 2013-10-18 ENCOUNTER — Encounter: Payer: Self-pay | Admitting: Family Medicine

## 2013-10-18 NOTE — Progress Notes (Signed)
I have seen and examined this patient. I have discussed with Dr Caleb PoppNettey.  I agree with their findings and plans as documented in their Regulatory Visit note.   Would try trial off of iron on next regulatory visit.   Stage 7 dementia with dependence in all ADLs and minimal language.  No behavioral manifestations of dementia.  No complications from edema.  (+) DNR.

## 2013-11-12 ENCOUNTER — Encounter: Payer: Self-pay | Admitting: Pharmacist

## 2013-12-18 ENCOUNTER — Encounter: Payer: Self-pay | Admitting: Pharmacist

## 2013-12-20 ENCOUNTER — Non-Acute Institutional Stay: Payer: PRIVATE HEALTH INSURANCE | Admitting: Family Medicine

## 2013-12-20 DIAGNOSIS — F0280 Dementia in other diseases classified elsewhere without behavioral disturbance: Secondary | ICD-10-CM

## 2013-12-20 DIAGNOSIS — G309 Alzheimer's disease, unspecified: Principal | ICD-10-CM

## 2013-12-20 DIAGNOSIS — M6289 Other specified disorders of muscle: Secondary | ICD-10-CM

## 2013-12-20 DIAGNOSIS — D509 Iron deficiency anemia, unspecified: Secondary | ICD-10-CM

## 2013-12-20 DIAGNOSIS — M623 Immobility syndrome (paraplegic): Secondary | ICD-10-CM

## 2013-12-20 DIAGNOSIS — E119 Type 2 diabetes mellitus without complications: Secondary | ICD-10-CM

## 2013-12-20 DIAGNOSIS — H409 Unspecified glaucoma: Secondary | ICD-10-CM

## 2013-12-20 NOTE — Progress Notes (Signed)
[name], female   DOB: 12-21-1928, 80 y.o.   MRN: [id number]   Subjective:    [name], female    DOB: 1929-02-08, 80 y.o.   MRN: [id number]  HPI  Patient with history of dementia, diabetes mellitus type 2, hypertension, constipation, glaucoma and Parkinsonism. Patient not responsive to questions. Per nursing home progress notes, patient is at baseline.  Past Medical History  Diagnosis Date  . Depression   . Chronic kidney disease   . Hypertension   . Parkinson's disease     5/08 psych consult with Dr. Barbarann Ehlers, transient dfelirium, mood disorder with insomnia. 5/10 CT  scan severe atrophy. Postural tremor.  . Diabetes type 2, controlled   . Anemia   . 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 Peripheral arterial disease   . DJD (degenerative joint disease)   . Glaucoma   . Helicobacter pylori (H. pylori) infection 12 2004     treated   . Trochanteric bursitis of left hip   . Carpal tunnel syndrome    History  Substance Use Topics  . Smoking status: Never Smoker   . Smokeless tobacco: Former Neurosurgeon    Types: Snuff  . Alcohol Use: No   Allergies  Allergen Reactions  . Megace [Megestrol]     Adrenal insufficiency  . Amlodipine Besylate     REACTION: dizziness  . Donepezil Hydrochloride     REACTION: frequent urination  . Penicillins     REACTION: swelling    Review of Systems  Unable to perform ROS: Dementia       Objective:   Physical Exam  Eyes:  Left eye with pupil about 3mm and minimal reactivity. Patient would not keep eyes open. Patient would forcefully close eyes with attempts to manually open eyes.  Cardiovascular: Normal rate, regular rhythm and normal heart sounds.   Pulmonary/Chest: Effort normal and breath sounds normal. No respiratory distress. She has no decreased breath sounds. She has no wheezes.  Anterior auscultation  Abdominal: Soft. Bowel sounds are normal. She exhibits no distension. There  is no tenderness.  Musculoskeletal:  Bilateral hand contractures  Neurological: She is alert. She is not disoriented. She displays atrophy (right calf).          Assessment & Plan:

## 2013-12-25 NOTE — Assessment & Plan Note (Signed)
Iron supplementation discontinued. Will follow-up hemoglobin at next CBC.

## 2013-12-25 NOTE — Assessment & Plan Note (Signed)
Continue Xalantan and Timolol

## 2013-12-25 NOTE — Assessment & Plan Note (Signed)
No change to presentation.

## 2013-12-25 NOTE — Assessment & Plan Note (Signed)
Controlled on diet. No change to current management.

## 2014-01-03 NOTE — Progress Notes (Signed)
Patient ID: Chloe MinorsMozelle B Saintjean, female   DOB: 12/31/1928, 80 y.o.   MRN: [id number] I have seen and examined this patient. I have discussed with Dr Caleb PoppNettey.  I agree with their findings and plans as documented in their regulatory visit note.  Major Neurodegenerative Disorder - Mixed type - FAST stage >= 7c dementia.  No recent complications of her advanced dementia, e.g., aspiration, sepsis, UTIs, Advanced stage pressure ulcers, significant progressive weight loss.  - No behavioral or psychological symptoms of Dementia  - DNR(+) code status  Should patient show at least one of the complications of advanced dementia listed above, discussions about EOL care should begin with family.

## 2014-02-02 LAB — CBC WITH DIFFERENTIAL/PLATELET
Basophils Absolute: 0 10*3/uL
Basophils Relative: 0 %
EOSINOPHIL PERCENT: 5 %
Eosinophils Absolute: 0 10*3/uL
Granulocyte count absolute: 4.2 10*3/uL
Granulocyte percent: 60 %
HCT: 32 % — ABNORMAL LOW
HGB: 10.7 g/dL — ABNORMAL LOW
LYMPHOCYTES ABSOLUTE (KUC): 1.9 10*3/uL
LYMPHS PCT: 27 %
MCH: 22.8 pg — ABNORMAL LOW
MCHC: 33.1 g/dL
MCV: 68.7 fL — ABNORMAL LOW
Monocytes Relative: 8 %
RBC: 4.7 10*6/uL
RDW: 15.3 %
WBC: 7 10*3/uL
platelet count: 187 10*3/uL

## 2014-02-14 ENCOUNTER — Non-Acute Institutional Stay (INDEPENDENT_AMBULATORY_CARE_PROVIDER_SITE_OTHER): Payer: PRIVATE HEALTH INSURANCE | Admitting: Family Medicine

## 2014-02-14 ENCOUNTER — Encounter: Payer: Self-pay | Admitting: Family Medicine

## 2014-02-14 DIAGNOSIS — G309 Alzheimer's disease, unspecified: Secondary | ICD-10-CM

## 2014-02-14 DIAGNOSIS — N182 Chronic kidney disease, stage 2 (mild): Secondary | ICD-10-CM

## 2014-02-14 DIAGNOSIS — F0280 Dementia in other diseases classified elsewhere without behavioral disturbance: Secondary | ICD-10-CM

## 2014-02-14 DIAGNOSIS — L89611 Pressure ulcer of right heel, stage 1: Secondary | ICD-10-CM

## 2014-02-14 DIAGNOSIS — D509 Iron deficiency anemia, unspecified: Secondary | ICD-10-CM

## 2014-02-14 LAB — BASIC METABOLIC PANEL
BUN: 19 mg/dL (ref 6–23)
CALCIUM: 9 mg/dL (ref 8.4–10.5)
CHLORIDE: 102 meq/L (ref 96–112)
CO2: 26 meq/L (ref 19–32)
CREATININE: 0.79 mg/dL (ref 0.5–1.10)
GLUCOSE: 82 mg/dL (ref 70–99)
Potassium: 3.6 meq/L (ref 3.5–5.3)
Sodium: 135 meq/L (ref 135–145)

## 2014-02-14 LAB — VITAMIN B12: Vitamin B-12: 579 pg/mL (ref 211–911)

## 2014-02-14 LAB — FOLATE: FOLATE: 6.4 ng/mL (ref >5.4)

## 2014-02-14 LAB — FERRITIN: Ferritin: 518 ng/mL — ABNORMAL HIGH

## 2014-02-14 LAB — IRON AND TIBC
%SAT: 34 ug/dL
Iron: 64 ug/dL
TIBC: 124 ug/dL — ABNORMAL LOW
UIBC: 188 ug/dL — ABNORMAL LOW

## 2014-02-14 LAB — TRANSFERRIN: Transferrin: 150

## 2014-02-14 LAB — TSH: TSH: 3.658 u[IU]/mL (ref 0.350–4.500)

## 2014-02-23 DIAGNOSIS — L89611 Pressure ulcer of right heel, stage 1: Secondary | ICD-10-CM

## 2014-02-23 HISTORY — DX: Pressure ulcer of right heel, stage 1: L89.611

## 2014-02-23 NOTE — Assessment & Plan Note (Signed)
Patient with boots on to prevent progression. Appears improved from what was described in progress note. Will continue to monitor.

## 2014-02-23 NOTE — Assessment & Plan Note (Signed)
Hemoglobin stable at 10.7 on 02/02/14. No reports of bleeding. Will continue to hold iron supplementation.

## 2014-02-23 NOTE — Assessment & Plan Note (Signed)
Appears stable. Will continue to monitor for complications of advanced dementia.

## 2014-02-23 NOTE — Assessment & Plan Note (Signed)
Stage 2 based on improved creatinine and GFR

## 2014-02-23 NOTE — Progress Notes (Signed)
[name], female   DOB: 02/24/1929, 80 y.o.   MRN: [id number]   Subjective:    [name], female    DOB: 08/22/1928, 80 y.o.   MRN: [id number]  HPI  Patient with history of dementia, diabetes mellitus type 2, hypertension, constipation, glaucoma and Parkinsonism. Patient continues to not respond to questions. Per nursing home progress notes, patient continues to be at baseline although developed a new stage one pressure ulcer on her right heel. Wound care has been consulted and patient with boots . Past Medical History  Diagnosis Date  . Depression   . Chronic kidney disease   . Hypertension   . Parkinson's disease     5/08 psych consult with Dr. Barbarann EhlersWillford, transient dfelirium, mood disorder with insomnia. 5/10 CT  scan severe atrophy. Postural tremor.  . Diabetes type 2, controlled   . Anemia   . 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 Peripheral arterial disease   . DJD (degenerative joint disease)   . Glaucoma   . Helicobacter pylori (H. pylori) infection 12 2004     treated   . Trochanteric bursitis of left hip   . Carpal tunnel syndrome    History  Substance Use Topics  . Smoking status: Never Smoker   . Smokeless tobacco: Former NeurosurgeonUser    Types: Snuff  . Alcohol Use: No   Allergies  Allergen Reactions  . Megace [Megestrol]     Adrenal insufficiency  . Amlodipine Besylate     REACTION: dizziness  . Donepezil Hydrochloride     REACTION: frequent urination  . Penicillins     REACTION: swelling    Review of Systems  Unable to perform ROS: Dementia       Objective:   Physical Exam  Constitutional:  Patient sitting in wheelchair. No distress.   Eyes:  Patient would not cooperate with eye exam. She would actively close eyes with attempts to examine.  Cardiovascular: Normal rate, regular rhythm and normal heart sounds.   Pulmonary/Chest: Effort normal and breath sounds normal. No respiratory distress. She has no  decreased breath sounds. She has no wheezes.  Abdominal: Soft. Bowel sounds are normal. She exhibits no distension. There is no tenderness.  Musculoskeletal: She exhibits no edema.  Bilateral hand contractures  Neurological: She is alert. She is not disoriented. She displays atrophy (right calf).  Skin:  Right heel with dark bruising. Does not appear tender. No fluctuance. No skin erosion.        Assessment & Plan:

## 2014-02-26 ENCOUNTER — Encounter: Payer: Self-pay | Admitting: Family Medicine

## 2014-02-26 NOTE — Progress Notes (Signed)
Patient ID: Chloe MinorsMozelle B Westman, female   DOB: 03/17/1929, 80 y.o.   MRN: [id number] I discussed with  Dr Caleb PoppNettey.  I agree with their plans documented in their regulatory note.

## 2014-03-05 ENCOUNTER — Encounter: Payer: Self-pay | Admitting: Family Medicine

## 2014-03-05 LAB — BASIC METABOLIC PANEL
BUN: 16 mg/dL (ref 6–23)
CALCIUM: 9.2 mg/dL (ref 8.4–10.5)
CO2: 25 mEq/L (ref 19–32)
Chloride: 107 mEq/L (ref 96–112)
Creatinine: 0.8 mg/dL (ref 0.5–1.1)
Glucose: 80 mg/dL (ref 70–99)
POTASSIUM: 3.7 meq/L (ref 3.5–5.3)
Sodium: 139 mEq/L (ref 135–145)

## 2014-04-07 ENCOUNTER — Encounter: Payer: Self-pay | Admitting: Pharmacist

## 2014-04-08 ENCOUNTER — Non-Acute Institutional Stay: Payer: Medicare Other | Admitting: Family Medicine

## 2014-04-08 ENCOUNTER — Encounter: Payer: Self-pay | Admitting: Family Medicine

## 2014-04-08 DIAGNOSIS — H409 Unspecified glaucoma: Secondary | ICD-10-CM

## 2014-04-08 DIAGNOSIS — M6249 Contracture of muscle, multiple sites: Secondary | ICD-10-CM

## 2014-04-08 DIAGNOSIS — M62838 Other muscle spasm: Secondary | ICD-10-CM | POA: Insufficient documentation

## 2014-04-08 DIAGNOSIS — G20 Parkinson's disease: Secondary | ICD-10-CM

## 2014-04-08 DIAGNOSIS — D509 Iron deficiency anemia, unspecified: Secondary | ICD-10-CM

## 2014-04-08 DIAGNOSIS — G309 Alzheimer's disease, unspecified: Secondary | ICD-10-CM

## 2014-04-08 DIAGNOSIS — M623 Immobility syndrome (paraplegic): Secondary | ICD-10-CM

## 2014-04-08 DIAGNOSIS — F0280 Dementia in other diseases classified elsewhere without behavioral disturbance: Secondary | ICD-10-CM

## 2014-04-08 HISTORY — DX: Other muscle spasm: M62.838

## 2014-04-08 NOTE — Progress Notes (Signed)
[name], female   DOB: Jan 14, 1929, 81 y.o.   MRN: [id number] [facility]  Visit  Provider: R. Caleb Popp, MD Location of Care: [facility] Visit Information: a scheduled routine follow-up visit Patient accompanied self Source(s) of information for visit by [name].  Chief Complaint:  Chief Complaint  Patient presents with  . 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 - Increasingly rigid flexion contraction of bilateral hand fingers, wrists, elbows with adduction at shoulders  - rigid extension of knees.  Bilateral rigid thumb adduction against palms. - Nursing staff unable to apply hand splints because of rigidity of finger flexion - Hygiene of hands and prevention of wounds of hands by fingers very limited by spastic contractures of fingers. [name], asking about treatment options for finger contractures that would allow care of hands.     PARKINSONISM - First mentioned as diagnosis in [facility] EMR 09/03/2008 by Dr [name].  Pt noted to have stiff walk, (+) glabellar sign, masked face, resting hand tremor, and bilateral cogwheeling arm tone.  Pt started trial of Sinemet 25/100 three times daily at that time with modest improvement in rigidity. By April of 2011, Dr [name] assesses patient as having a poor response to Sinemet 25/100 and increases patient to 25/250 THREE TIMES DAILY in June 2011.  Again, patient assessed as having poor response, even at higher Sinemet dose in August of 2011.   [name] continued on Sinement 25/250 three times daily unitl it was stopped because of patient's progressive severe rigidity on the treatment.   There was no significant change in patient's rigidity off the Sinement.  - Patient  currently aphasic, practically akinetic.  She does swallow.  Her weight is declining, down 17 pounds in last 6 months.  - Patient is chair bound. She is completely dependent in all activities of daily living.   - Heel ulcers and recurrent superficial sacral / buttock pressure ulcers.    Iron deficiency anemia - Longstanding diagosis for pt. - Hgb 10.7 g/dL in Novemeber. - Taking daily iron tablet.  -    Glaucoma - Longstanding issue for patient - On latanoprost and Timoptic - Unable to assess subjective experience of vision.     Dementia - Longstanding issue - Patient showed no benefits to Namenda nor Aricept in past. - No behavioral difficulties. - No useful speech. Cannot sit up without assistance, Loss of ability to smile.           Outpatient Encounter Prescriptions as of 04/08/2014  Medication Sig  . acetaminophen (TYLENOL) 325 MG tablet Take 650 mg by mouth 2 (two) times daily. Don't exceed  of acetaminophen in 24 hours.  . bisacodyl (DULCOLAX) 10 MG suppository Place 10 mg rectally daily as needed for moderate constipation (If not relieved by Milk of Magnesium).  . Cholecalciferol (VITAMIN D3) 50000 UNITS CAPS Take 50,000 Units by mouth every 30 (thirty) days. On the 15th of each month  . docusate sodium (COLACE) 100 MG capsule Take 100 mg by mouth daily.  . feeding supplement, ENSURE, (ENSURE) PUDG Take 1 Container by mouth daily.  . ferrous sulfate 325 (65 FE) MG tablet Take 325 mg by mouth daily with breakfast.  . latanoprost (XALATAN) 0.005 % ophthalmic solution Place 1 drop into both  eyes daily.  . Multiple Vitamin (MULTIVITAMIN) tablet Take 1 tablet by mouth daily.    Marland Kitchen senna (SENOKOT) 8.6 MG TABS tablet Take 1 tablet by mouth every Monday, Wednesday, and Friday.  . timolol (TIMOPTIC-XE) 0.5 % ophthalmic gel-forming Place 1 drop into both eyes daily.  . magnesium hydroxide (MILK OF MAGNESIA) 400 MG/5ML suspension Take 30 mLs by mouth daily as needed for mild  constipation (If no bowel movement in 3 days).  . Sodium Phosphates (TGT SALINE LAXATIVE) ENEM Place 1 application rectally daily as needed (If no relief by suppository).   History Patient Active Problem List   Diagnosis Date Noted  . 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 Stage I pressure ulcer of right heel 02/23/2014  . Stage I pressure ulcer of sacral region 08/21/2013  . Overweight (BMI 25.0-29.9) 06/22/2012  . Chronic kidney disease   . Iron deficiency anemia   . Glaucoma   . Immobility syndrome 01/20/2010  . Diabetes mellitus type 2, controlled 05/19/2009  . Person living in residential institution 10/15/2008  . PARKINSONISM 07/15/2008  . Alzheimer's disease 07/19/2006  . DEGENERATIVE JOINT DISEASE, KNEE 07/19/2006   Past Medical History  Diagnosis Date  . Depression   . Chronic kidney disease   . Hypertension   . Parkinson's disease     5/08 psych consult with Dr. Barbarann Bowen, transient dfelirium, mood disorder with insomnia. 5/10 CT  scan severe atrophy. Postural tremor.  . Diabetes type 2, controlled   . Anemia   . 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 Peripheral arterial disease   . DJD (degenerative joint disease)   . Glaucoma   . Helicobacter pylori (H. pylori) infection 12 2004     treated   . Trochanteric bursitis of left hip   . Carpal tunnel syndrome   . 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 Appendectomy  1944  . Tubal ligation    . Vaginal hysterectomy      For heavy bleeding   . Cataract extraction, bilateral  1990  . Cardaic cath    . Cardiac catheterization  03/2003    normal  . Cardiac pacemaker placement  05/2003    [facility]   . Oophorectomy  07/2003    for mucinous cyst adenoma   . Trabeculectomy  07/2006   Family History  Problem Relation Age of Onset  . Breast cancer Daughter 11    Deceased   . Colon  cancer Daughter 47  . Diabetes Sister   . Diabetes Father   . Hypertension Mother   . Alzheimer's disease Mother     reports that she has never smoked. She has quit using smokeless tobacco. Her smokeless tobacco use included Snuff. She reports that she does not drink alcohol or use illicit drugs.  Basic Activities of Daily Living  Dependent or needs assistance in all ADLs  Instrumental Activities of Daily Living Dependent or needs assistance in all iADLs    Falls in the past six months:No   Supplemental shakes:  yes  Review of Systems  See HPI General: Denies fevers, chills, weight loss, fatigue, weight gain.  Eyes: Denies pain, blurred vision, discharge.  Ears/Nose/Throat: Denies ear pain, throat pain, rhinorrhea, nasal congestion.  Cardiovascular: Denies complaints, chest pains, palpitations, dyspnea on exertion, orthopnea, peripheral edema.  Respiratory: Denies cough, sputum, dyspnea.  Gastrointestinal: Denies abd pain, bloating, constipation, diarrhea.  Genitourinary: Denies dysuria, urinary frequency,  discharge, pelvic pain.  Musculoskeletal: Denies joint pain, swelling, weakness.  Skin: Denies skin rash or ulcers. Neurologic: Denies transient paralysis, weakness, paresthesias, headache.  Psychiatric: Denies depression, anxiety, psychosis. Endocrine: Denies weight change.   PHYSICAL EXAM:. Wt Readings from Last 3 Encounters:  03/28/14 138 lb 9.6 oz (62.869 kg)  10/16/13 155 lb (70.308 kg)  05/16/13 165 lb (74.844 kg)   Temp Readings from Last 3 Encounters:  10/16/13 97.6 F (36.4 C)   08/17/13 97 F (36.1 C)   07/16/13 97.6 F (36.4 C)    BP Readings from Last 3 Encounters:  03/16/14 112/72  10/16/13 137/76  08/17/13 131/79   Pulse Readings from Last 3 Encounters:  03/28/14 72  10/16/13 68  08/17/13 64   VS reviewed General: No acute distress, reclining in Geri chair in day room. Left leg extended rigidly and fully at knee.  Right leg flexed rigidly at  knee at about 30 degress.   Neck:  Supple, No JVD, no lymphadenopathy CV:  RRR, 2/6 SEM,  Ankles in Prevalon boots bilateral.  RESP: No resp distress or accessory muscle use.  Clear to ausc bilat. No wheezing, no rales, no rhonchi.  ABD:  Soft, Non-tender, non-distended, +bowel sounds, no masses MSK:  No back pain, no joint pain.  No joint swelling or redness EXT:  Right arm: tight flexion contracture of fingers and adduction across palm of thumb.  Unable to extend with gentle traction.  Right elbow in held full flexion, able to extend approximately 20 to 30 degrees with gentle traction. Elbow returns to full flexion within minute of stretch.   Unable to examine palm.    Left arm: Tight flexion contracture of fingers and adduction underneath 2nd and 3rd fingers.  Left elbow in full flexion, able to extend to approximately 20 to 30 degrees with gentle traction.  Elbow returns to full flexion within minute of stretch.  Unable to examine palm.    Neurologic:  Facial masking, eyes open during exam with no eyelid blink for 30 second periods.  No eye roving movement. No spontaneous speech.  No responsive speech. No spontaneous muscle movements except for slow return of elbow flexions after extension stretches.     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  - Prior attempts with Sinemet to treat potential Parkinsonism rigidity was not helpful, even at 750 mg of Levodopamine a day dosage. - If patient's family wants to attempt to palliate any underlying her hands spasticity in order to provide hand hygiene, then BoTox  injection of hand / finger flexors.  I am uncertain how much of her finger flexions are reversible spasticity and how much contractures that would require surgical release to effect extension of the fingers.  - Plan: Will discuss with granddgt, [name], a short trial of oral antispasmotics, e.g., Baclofen, to see if her fingers could be extended enough to allow hygience care and prevent pressure ulcers.  Will need to monitor for sedation and decrease oral intake if oral treatment tried.    PARKINSONISM - Progressive. - Palliative Performance Scale ~ 30%. - Ms Petrizzo's functional state meets criteria for Hospice Qualifying Diagnosis of Parkinson's Disease given dependence in  all ADLs and continuing weight loss - Patient's code status is DNR.    Iron deficiency anemia - Stable Hemoglobin in Novemeber. - Given weight loss, if seems reasonable to consider stopping iron supplement.  - Trial of Mirtazapine for appetite stimulation in 2010 by Dr [name] was stopped without benefit.    Glaucoma - Stable as best we can tell - Continue Lantoprost and Timoptic   Alzheimer's disease - FAST Stage: 7D Palliative Performance Score: 30.  Progression since last assessment: progressive weight loss Behavioral and Psychological Symptoms of Dementia: none  Pharmacologic treatments of patient: prior treatment with Donepezil and memantine  Response: Poor Adverse effects from pharmacologic treatments: Urinary incontinence with Donepezil Long-term care planning: Patient would meet Hospice Qualifying diagnosis for Alzheimers Disease given FAST 7d stabe and ongoing weight loss.   Will discuss with granddgt, [name], if she would like to consult with Palliative Care.          Code Status:    DNR  Emergency contact: [name]  Follow Up:  Next 60 days unless acute issues arise.

## 2014-04-09 ENCOUNTER — Non-Acute Institutional Stay (INDEPENDENT_AMBULATORY_CARE_PROVIDER_SITE_OTHER): Payer: Medicare Other | Admitting: Family Medicine

## 2014-04-09 DIAGNOSIS — G20 Parkinson's disease: Secondary | ICD-10-CM

## 2014-04-27 NOTE — Assessment & Plan Note (Signed)
Patient with worsening contractures of her hands. Neurology consult placed. Will follow-up at next nursing home visit.

## 2014-04-27 NOTE — Progress Notes (Signed)
[name], female   DOB: 06/20/1928, 81 y.o.   MRN: [id number]   Subjective:    [name], female    DOB: 02/26/1929, 81 y.o.   MRN: [id number]  HPI  Patient with history of dementia, diabetes mellitus type 2, hypertension, constipation, glaucoma and Parkinsonism. Patient continues to not respond to questions. Per nursing home progress notes, patient's contractures are worsening. Neurology consult placed per chart review. . Past Medical History  Diagnosis Date  . Depression   . Chronic kidney disease   . Hypertension   . Parkinson's disease     5/08 psych consult with Dr. Barbarann EhlersWillford, transient dfelirium, mood disorder with insomnia. 5/10 CT  scan severe atrophy. Postural tremor.  . Diabetes type 2, controlled   . Anemia   . 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 Peripheral arterial disease   . DJD (degenerative joint disease)   . Glaucoma   . Helicobacter pylori (H. pylori) infection 12 2004     treated   . Trochanteric bursitis of left hip   . Carpal tunnel syndrome   . 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 Smoking status: Never Smoker   . Smokeless tobacco: Former NeurosurgeonUser    Types: Snuff  . Alcohol Use: No   Allergies  Allergen Reactions  . Megace [Megestrol]     Adrenal insufficiency  . Amlodipine Besylate     REACTION: dizziness  . Donepezil Hydrochloride     REACTION: frequent urination  . Penicillins     REACTION: swelling    Review of Systems  Unable to perform ROS: Dementia       Objective:  BP 125/59 mmHg  Pulse 72  Temp(Src) 97.5 F (36.4 C) (Oral)  Resp 19   Physical Exam  Constitutional:  Patient sitting in wheelchair. No distress.   Eyes:  Patient would not cooperate with eye exam. She would actively close eyes with attempts to examine.  Cardiovascular: Normal rate, regular rhythm and normal heart sounds.   Pulmonary/Chest:  Effort normal and breath sounds normal. No respiratory distress. She has no decreased breath sounds. She has no wheezes.  Abdominal: Soft. Bowel sounds are normal. She exhibits no distension. There is no tenderness.  Musculoskeletal: She exhibits no edema.  Bilateral hand contractures  Neurological: She is alert. She is not disoriented. She displays atrophy (right calf).  Skin:  Heel boots present bilaterally. Right heel appears healed from previous exam. No visible ulcer.        Assessment & Plan:

## 2014-04-28 NOTE — Progress Notes (Signed)
Patient ID: Chloe MinorsMozelle B Mayson, female   DOB: 04/08/1928, 81 y.o.   MRN: [id number] I reviewed this regulatory visit by Dr Caleb PoppNettey. I agree with his plan though I would recommend holding off on referral to Neurology for hand contractures until patient's granddaughter, Christean GriefVondra Humphreys has evaluated the efficacy of Baclofen 10 mg THREE TIMES DAILY on allowing her to protect her palms.

## 2014-04-29 ENCOUNTER — Other Ambulatory Visit: Payer: Self-pay | Admitting: Family Medicine

## 2014-04-29 MED ORDER — BACLOFEN 10 MG PO TABS: 10.0000 mg | ORAL_TABLET | Freq: Three times a day (TID) | ORAL | Status: AC

## 2014-05-05 ENCOUNTER — Encounter: Payer: Self-pay | Admitting: Family Medicine

## 2014-05-12 ENCOUNTER — Telehealth: Payer: Self-pay | Admitting: Family Medicine

## 2014-05-12 NOTE — Telephone Encounter (Signed)
[facility] reporting that overnight pt had an episode of bradycardia; pt has a pacemaker. Otherwise pt appears to be at baseline (little interaction / response to conversation, etc) with otherwise stable vital signs, no fever, and normal CBG's. NP reports that there has been no follow up with cardiology for outpt pacer checks in quite some time, and she is unsure if pt has had remote checks or not. NP states she will discuss with family / decision makers for pt about possibly being evaluated for pacemaker failure and / or replacement of pacer battery as appropriate. Per NP, labs have also been ordered to evaluate for other causes of bradycardia. Will communicate with Dr. McDiarmid and follow up further, as needed. Will also communicate to on-coming geriatrics rotation resident Dr. Birdie SonsSonnenberg, who starts tomorrow (2/16).  Note FYI to Dr. Perley JainMcDiarmid and resident PCP, Dr. Caleb PoppNettey.  Chloe Mortonhristopher M Street, MD PGY-3, [facility] 05/12/2014, 10:58 AM

## 2014-05-13 ENCOUNTER — Encounter: Payer: Self-pay | Admitting: Family Medicine

## 2014-05-13 ENCOUNTER — Non-Acute Institutional Stay: Payer: Medicare Other | Admitting: Family Medicine

## 2014-05-13 DIAGNOSIS — Z8679 Personal history of other diseases of the circulatory system: Secondary | ICD-10-CM

## 2014-05-13 DIAGNOSIS — M6249 Contracture of muscle, multiple sites: Secondary | ICD-10-CM

## 2014-05-13 DIAGNOSIS — Z950 Presence of cardiac pacemaker: Secondary | ICD-10-CM | POA: Insufficient documentation

## 2014-05-13 DIAGNOSIS — M62838 Other muscle spasm: Secondary | ICD-10-CM

## 2014-05-13 HISTORY — DX: Personal history of other diseases of the circulatory system: Z86.79

## 2014-05-13 NOTE — Assessment & Plan Note (Addendum)
Patient is at level of consciousness that I consider her baseline with eye opening to painful stimulation, no localization, no spontaneous movement. Slightly lower BP but this has been variable.  Patient's pacemaker has not been checked since 2009 per our documentation, so the pacer battery may have no voltage left.   Patient's CBC, BMET, Urinalysis 05/11/14) were unremarkable.   A/ P Possible subjective assessment of decreased level of consciousness - Do not think it isprimarily related to heart rate. Will monitor BP.  Will need to discuss with HCPOA agent whether or not to seek Pacemaker interrogation or not.  Pt is DNR.  - No evidence of active metabolic or infectious process. - Possible role from Baclofen in decreasing LOC.  As baclofen does not appear to have improved the bilateral finger flexion, will titrate off the Baclofen over the next week.

## 2014-05-13 NOTE — Progress Notes (Signed)
   Subjective:    [name], female    DOB: 11/24/1928, 81 y.o.   MRN: [id number]  HPI  [facility] nurse contacted 05/11/14 for decrease level of consciousness. Patients pulse noted to be 48 with BP 103/67.  LOC is at her  baseline today. No interventions provided.     PMH significant for SSS treaated with Dual chamber pacemaker by Dr Jenne CampusMcQueen in 2005.  Last check of Pacemaker in chart is 2009.  Patient recently started on Baclofen to try and decrease spasticity in hands   Review of Systems Patient unable to reply secondary to profound dementia.    Objective:   Physical Exam  Sleep flat in bed laying on left side.  Opens eyes to stimulation No spontaneous movements No spontaneous vocalizations Does not follow commands.  Heart: HR 48 and regular, no murmur or gallup,  Abdomen: soft, NT, ND, (+) BS Ext: no edema, appropriately warm to touch. Unable to extend fingers on either hand.  Both hands held in tight flexion of fingers.  No overt skin breakdown of palms seen         Assessment & Plan:  See Problem list

## 2014-05-13 NOTE — Assessment & Plan Note (Signed)
No improvement in tight flexion of fingers against palms bilaterally with trial of Baclofen oral. Will titrate off Baclofen over the week.

## 2014-05-28 ENCOUNTER — Other Ambulatory Visit: Payer: Self-pay | Admitting: Family Medicine

## 2014-06-06 ENCOUNTER — Encounter: Payer: Self-pay | Admitting: Family Medicine

## 2014-06-06 ENCOUNTER — Non-Acute Institutional Stay: Payer: Medicare Other | Admitting: Family Medicine

## 2014-06-06 DIAGNOSIS — M6249 Contracture of muscle, multiple sites: Secondary | ICD-10-CM | POA: Diagnosis not present

## 2014-06-06 DIAGNOSIS — L89152 Pressure ulcer of sacral region, stage 2: Secondary | ICD-10-CM

## 2014-06-06 DIAGNOSIS — F0280 Dementia in other diseases classified elsewhere without behavioral disturbance: Secondary | ICD-10-CM

## 2014-06-06 DIAGNOSIS — Z950 Presence of cardiac pacemaker: Secondary | ICD-10-CM

## 2014-06-06 DIAGNOSIS — G309 Alzheimer's disease, unspecified: Secondary | ICD-10-CM | POA: Diagnosis not present

## 2014-06-06 DIAGNOSIS — M62838 Other muscle spasm: Secondary | ICD-10-CM

## 2014-06-06 NOTE — Progress Notes (Signed)
Patient ID: Jorene Minors, female   DOB: March 09, 1929, 81 y.o.   MRN: [id number] [facility]  Visit  Provider: R. Caleb Popp, MD Location of Care: [facility] Visit Information: a scheduled routine follow-up visit Patient accompanied self  Nursing concerns: none Nutrition concerns; Eating 75 to 100% of meals. Taking one [employer] a day, Receiving Prostat.  Wound Care concerns: Coccygeal pressure ulcer 6 cm x 0.5 cm x 0.1 cm, open, pink, without odor or drainage. Healing with NS cleansing and silver alginate dressing.  Patient has Side-lying wedge, and Prevalon boots  Chief Complaint:  Chief Complaint  Patient presents with  . 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 - Increasingly rigid flexion contraction of bilateral hand fingers, wrists, elbows with adduction at shoulders  - rigid extension of knees.  Bilateral rigid thumb adduction against palms. - Nursing staff unable to apply hand splints because of rigidity of finger flexion - Hygiene of hands and prevention of wounds of hands by fingers very limited by spastic contractures of fingers. Deneen Harts, able to place intervening cloth between flexed fingers and palms since start of Baclofen 10 mg three times a day.  No report of worsening in Ms Shubert sedation with introduction of Baclofen.     PARKINSONISM - First mentioned as diagnosis in [facility] EMR 09/03/2008 by Dr Sheffield Slider.  Pt noted to have stiff walk, (+) glabellar sign, masked face, resting hand tremor, and bilateral cogwheeling arm tone.  Pt started trial of Sinemet 25/100 three times daily at that time with modest improvement in rigidity. By April of 2011, Dr Sheffield Slider assesses patient as having a poor response to Sinemet 25/100 and increases patient to 25/250  THREE TIMES DAILY in June 2011.  Again, patient assessed as having poor response, even at higher Sinemet dose in August of 2011.   Ms Belinsky continued on Sinement 25/250 three times daily unitl it was stopped because of patient's progressive severe rigidity on the treatment.   There was no significant change in patient's rigidity off the Sinement.  - Patient currently aphasic, practically akinetic.  She does swallow.  Her weight is declining, down 17 pounds in last 6 months but has stablizes in the last 2 months. - Patient is chair bound. She is completely dependent in all activities of daily living.   - Healing coccygeal Pressure ulcer.    Dementia - Longstanding issue - Patient showed no benefits to Namenda nor Aricept in past. - No behavioral difficulties. - No useful Language. Cannot sit up without assistance, Loss of ability to smile.          Outpatient Encounter Prescriptions as of 06/06/2014  Medication Sig  . acetaminophen (TYLENOL) 325 MG tablet Take 650 mg by mouth 2 (two) times daily. Don't exceed  of acetaminophen in 24 hours.  . Amino Acids-Protein Hydrolys (FEEDING SUPPLEMENT, PRO-STAT SUGAR FREE 64,) LIQD Take 30 mLs by mouth daily.  . baclofen (LIORESAL) 10 MG tablet Take 1 tablet (10 mg total) by mouth 3 (three) times daily.  . bisacodyl (DULCOLAX) 10 MG suppository Place 10 mg rectally daily as needed for moderate constipation (If not relieved by Milk of Magnesium).  . Cholecalciferol (VITAMIN D3) 50000 UNITS CAPS Take 50,000 Units by mouth every 30 (thirty) days. On the 15th of each month  .  docusate sodium (COLACE) 100 MG capsule Take 100 mg by mouth daily.  . feeding supplement, ENSURE, (ENSURE) PUDG Take 1 Container by mouth daily.  Marland Kitchen. latanoprost (XALATAN) 0.005 % ophthalmic solution Place 1 drop into both eyes daily.  . Multiple Vitamin (MULTIVITAMIN) tablet Take 1 tablet by mouth daily.    Marland Kitchen. senna (SENOKOT) 8.6 MG TABS tablet Take 1 tablet by mouth every  Monday, Wednesday, and Friday.  . timolol (TIMOPTIC-XE) 0.5 % ophthalmic gel-forming Place 1 drop into both eyes daily.  . magnesium hydroxide (MILK OF MAGNESIA) 400 MG/5ML suspension Take 30 mLs by mouth daily as needed for mild constipation (If no bowel movement in 3 days).  . Sodium Phosphates (TGT SALINE LAXATIVE) ENEM Place 1 application rectally daily as needed (If no relief by suppository).  . [DISCONTINUED] ferrous sulfate 325 (65 FE) MG tablet Take 325 mg by mouth daily with breakfast.   History Patient Active Problem List   Diagnosis Date Noted  . Cardiac pacemaker in situ 05/13/2014  . H/O sick sinus syndrome 05/13/2014  . 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 Stage I pressure ulcer of right heel 02/23/2014  . Stage I pressure ulcer of sacral region 08/21/2013  . Overweight (BMI 25.0-29.9) 06/22/2012  . Chronic kidney disease   . Iron deficiency anemia   . Glaucoma   . Immobility syndrome 01/20/2010  . Diabetes mellitus type 2, controlled 05/19/2009  . Person living in residential institution 10/15/2008  . PARKINSONISM 07/15/2008  . Alzheimer's disease 07/19/2006  . DEGENERATIVE JOINT DISEASE, KNEE 07/19/2006   Past Medical History  Diagnosis Date  . Depression   . Chronic kidney disease   . Hypertension   . Parkinson's disease     5/08 psych consult with Dr. Barbarann EhlersWillford, transient dfelirium, mood disorder with insomnia. 5/10 CT  scan severe atrophy. Postural tremor.  . Diabetes type 2, controlled   . Anemia   . 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 Peripheral arterial disease   . DJD (degenerative joint disease)   . Glaucoma   . Helicobacter pylori (H. pylori) infection 12 2004     treated   . Trochanteric bursitis of left hip   . Carpal tunnel syndrome   . 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 H/O sick sinus syndrome 05/13/2014   Past Surgical History  Procedure Laterality  Date  . Appendectomy  1944  . Tubal ligation    . Vaginal hysterectomy      For heavy bleeding   . Cataract extraction, bilateral  1990  . Cardaic cath    . Cardiac catheterization  03/2003    normal  . [facility]iac pac[facility]  05/2003    [facility]   . Oophorectomy  07/2003    for mucinous cyst adenoma   . Trabeculectomy  07/2006   Family History  Problem Relation Age of Onset  . Breast cancer Daughter 2645    Deceased   . Colon cancer Daughter 4659  . Diabetes Sister   . Diabetes Father   . Hypertension Mother   . Alzheimer's disease Mother     reports that she has never smoked. She has quit using smokeless tobacco. Her smokeless tobacco use included Snuff. She reports that she does not drink alcohol or use illicit drugs.  Basic Activities of Daily Living  Dependent or needs assistance in all ADLs  Instrumental Activities of Daily Living Dependent or needs assistance in all iADLs    Falls  in the past six months:No   Supplemental shakes:  yes  Review of Systems  Unable to obtain from patient due to advance dementia  PHYSICAL EXAM:. Wt Readings from Last 3 Encounters:  05/29/14 137 lb 9.6 oz (62.415 kg)  03/28/14 138 lb 9.6 oz (62.869 kg)  10/16/13 155 lb (70.308 kg)   Temp Readings from Last 3 Encounters:  05/31/14 97.5 F (36.4 C)   05/11/14 97.3 F (36.3 C)   03/29/14 97.5 F (36.4 C) Oral   BP Readings from Last 3 Encounters:  05/31/14 127/90  05/11/14 103/67  04/09/14 125/59   Pulse Readings from Last 3 Encounters:  06/01/14 78  05/13/14 48  03/29/14 72   VS reviewed General: No acute distress, reclining in Geri chair in day room. Left leg extended rigidly and fully at knee.  Right leg flexed rigidly at knee at about 30 degress.   Neck:  Supple, No JVD, no lymphadenopathy CV:  RRR, 2/6 SEM,  Ankles in Prevalon boots bilateral.  RESP: No resp distress or accessory muscle use.  Clear to ausc bilat. No wheezing, no rales, no rhonchi.  ABD:  Soft,  Non-tender, non-distended, +bowel sounds, no masses MSK:  No back pain, no joint pain.  No joint swelling or redness EXT:  Right arm: tight flexion contracture of fingers and adduction across palm of thumb.  Unable to extend with gentle traction.  Right elbow in held full flexion, able to extend approximately 20 to 30 degrees with gentle traction. Elbow returns to full flexion within minute of stretch.   Unable to examine palm.    Left arm: Tight flexion contracture of fingers and adduction underneath 2nd and 3rd fingers.  Left elbow in full flexion, able to extend to approximately 20 to 30 degrees with gentle traction.  Elbow returns to full flexion within minute of stretch.  Unable to examine palm.    Neurologic:  Facial masking, eyes open during exam with no eyelid blink for 30 second periods.  No eye roving movement. No spontaneous speech.  No responsive speech. No spontaneous muscle movements except for slow return of elbow flexions after extension stretches.     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 Ms Vanwinkle appears to be tolerating Baclofen.  - Prior attempts with Sinemet to treat potential Parkinsonism rigidity was not helpful, even at 750 mg of Levodopamine a day dosage. - If patient's family wants to attempt to palliate any underlying her hands spasticity in order to provide hand hygiene, then BoTox injection of hand / finger flexors.  I am uncertain how much of her finger flexions are reversible spasticity and how much contractures that would require surgical release to effect extension of the fingers.  - Plan: Will continue Baclofen 10 mg three times a day oral   PARKINSONISM - Progressive. - Palliative Performance Scale remains  around  30%. - Ms Milbourn's functional state meets criteria for Hospice Qualifying Diagnosis of Parkinson's Disease given dependence in all ADLs and continuing weight loss - Patient's code status is DNR.    Glaucoma - Stable as best we can tell - Continue Lantoprost and Timoptic   Alzheimer's disease - FAST Stage: 7D Palliative Performance Score: 30.  Progression since last assessment: progressive weight loss Behavioral  and Psychological Symptoms of Dementia: none  Pharmacologic treatments of patient: prior treatment with Donepezil and memantine  Response: Poor Adverse effects from pharmacologic treatments: Urinary incontinence with Donepezil Long-term care planning: Patient would meet Hospice Qualifying diagnosis for Alzheimers Disease given FAST 7d stabe and ongoing weight loss.   Family aware of option of Hospice enrollment.        Sick Sinus Syndrome - Unaware if pacemaker from 2009 is capturing.  Battery life may have ended. - Family has been given information and options of returning to [facility] for PM interrogation for consideration of PM battery change.  There has been no expression of choosing this consultation by family of which we are aware. - Currently sampling of patient's pulses have been in an acceptable range.    Code Status:    DNR  Emergency contact: Sherrie Mustache  Follow Up:  Next 60 days unless acute issues arise.

## 2014-06-15 ENCOUNTER — Non-Acute Institutional Stay: Payer: Medicare Other | Admitting: Family Medicine

## 2014-06-15 DIAGNOSIS — G20 Parkinson's disease: Secondary | ICD-10-CM

## 2014-06-15 DIAGNOSIS — Z8679 Personal history of other diseases of the circulatory system: Secondary | ICD-10-CM

## 2014-06-15 DIAGNOSIS — L89611 Pressure ulcer of right heel, stage 1: Secondary | ICD-10-CM | POA: Diagnosis not present

## 2014-06-16 ENCOUNTER — Encounter: Payer: Self-pay | Admitting: Pharmacist

## 2014-06-18 NOTE — Assessment & Plan Note (Signed)
Per notes, pacemaker may be failing. No plans to pursue change in pacemaker battery. Will get in touch with HCPOA to discuss patient's continued management.

## 2014-06-18 NOTE — Progress Notes (Signed)
[name], female   DOB: 01/16/1929, 81 y.o.   MRN: [id number]   Subjective:    [name], female    DOB: 01/17/1929, 81 y.o.   MRN: [id number]  HPI  Patient with history of dementia, diabetes mellitus type 2, hypertension, constipation, glaucoma and Parkinsonism. Patient continues to not respond to questions. Per nursing home progress notes, patient's contractures are worsened and did not improve with Baclofen. Patient with recent history of bradycardia. Cannot assess if symptomatic, although does not seem to have had blood pressure issues. Nursing home team discussed case with HCPOA regarding option of replacing heart pacer (which is likely failing). Management at this time towards not seeking intervention for replacement of pacer batteries at this time. . Past Medical History  Diagnosis Date  . Depression   . Chronic kidney disease   . Hypertension   . Parkinson's disease     5/08 psych consult with Dr. Barbarann EhlersWillford, transient dfelirium, mood disorder with insomnia. 5/10 CT  scan severe atrophy. Postural tremor.  . Diabetes type 2, controlled   . Anemia   . 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 Peripheral arterial disease   . DJD (degenerative joint disease)   . Glaucoma   . Helicobacter pylori (H. pylori) infection 12 2004     treated   . Trochanteric bursitis of left hip   . Carpal tunnel syndrome   . 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 H/O sick sinus syndrome 05/13/2014   History  Substance Use Topics  . Smoking status: Never Smoker   . Smokeless tobacco: Former NeurosurgeonUser    Types: Snuff  . Alcohol Use: No   Allergies  Allergen Reactions  . Megace [Megestrol]     Adrenal insufficiency  . Amlodipine Besylate     REACTION: dizziness  . Donepezil Hydrochloride     REACTION: frequent urination  . Penicillins     REACTION: swelling    Review of Systems  Unable to perform ROS: Dementia        Objective:  There were no vitals taken for this visit.   Physical Exam  Constitutional:  Patient sitting in wheelchair. No distress.   Eyes:  Patient would not cooperate with eye exam. She would actively close eyes with attempts to examine.  Cardiovascular: Normal rate, regular rhythm and normal heart sounds.   Pulmonary/Chest: Effort normal and breath sounds normal. No respiratory distress. She has no decreased breath sounds. She has no wheezes.  Abdominal: Soft. Bowel sounds are normal. She exhibits no distension. There is no tenderness.  Musculoskeletal: She exhibits no edema.  Bilateral hand contractures  Neurological: She is alert. She is not disoriented. She displays atrophy (right calf).  Skin:  Heel boots present bilaterally. Right heel appears healed from previous exam. No visible ulcer.        Assessment & Plan:

## 2014-07-01 ENCOUNTER — Encounter: Payer: Self-pay | Admitting: Family Medicine

## 2014-07-01 NOTE — Assessment & Plan Note (Signed)
Resolved

## 2014-07-01 NOTE — Assessment & Plan Note (Signed)
Appears stable. Contractures did not respond well to Baclofen.

## 2014-07-02 NOTE — Progress Notes (Signed)
Patient ID: Jorene MinorsMozelle B Bowen, female   DOB: 02/27/1929, [age] y.o.   MRN: [id number] I discussed with  Dr Caleb PoppNettey.  I agree with their plans documented in their regulatory visit note.

## 2014-07-18 ENCOUNTER — Encounter: Payer: Self-pay | Admitting: Pharmacist

## 2014-08-18 ENCOUNTER — Encounter: Payer: Self-pay | Admitting: Student-PharmD

## 2014-08-19 IMAGING — CT CT HEAD W/O CM
2 series · 16 of 30 positions shown, 18 images · non-contrast
Comparison: 08/23/2008

CLINICAL DATA: Unwitnessed fall, forehead injury

EXAM:
CT HEAD WITHOUT CONTRAST
TECHNIQUE: Contiguous axial images were obtained from the base of the skull
through the vertex without intravenous contrast.

[Series 2: head w/o · axial · non-contrast · 0.49mm/px · z∈[+95,+235]mm · 8 of 36 slices shown, 10 images]
[im 4/36  brain]
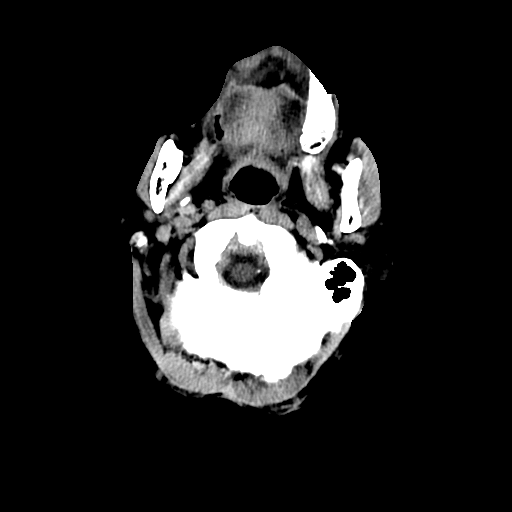
[im 4/36  bone]
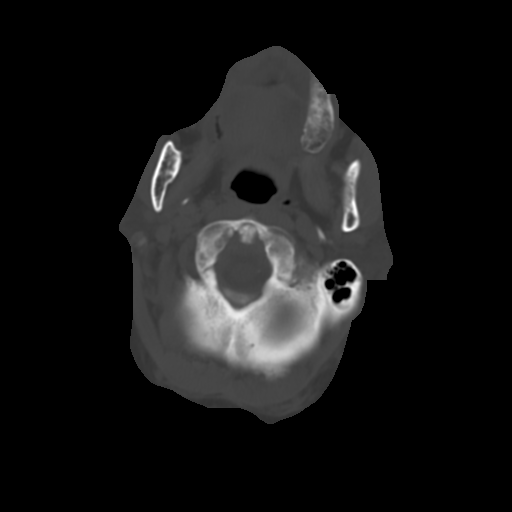
[im 8/36  brain]
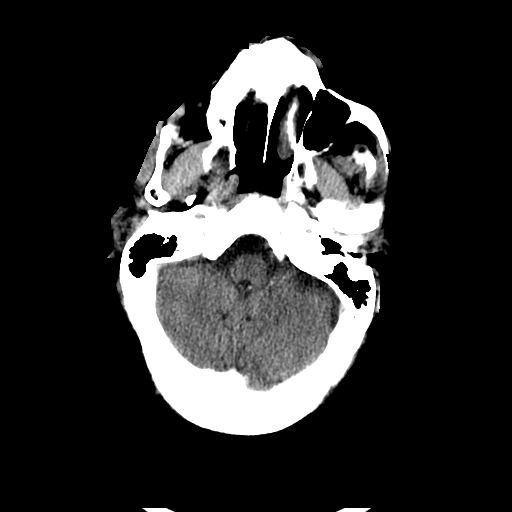
[im 12/36  brain]
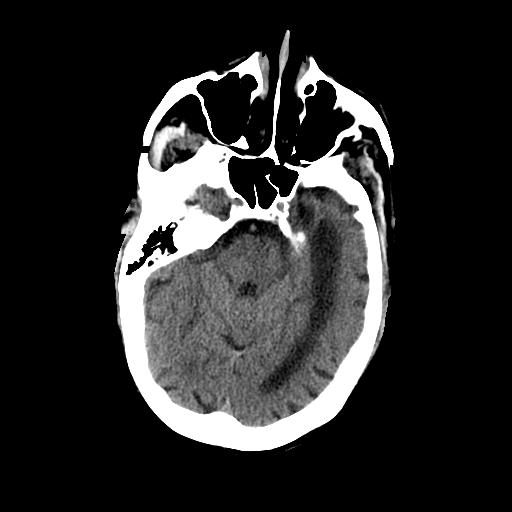
[im 16/36  brain]
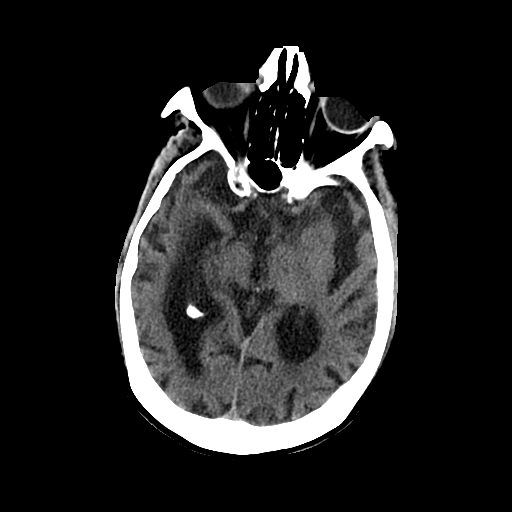
[im 20/36  brain]
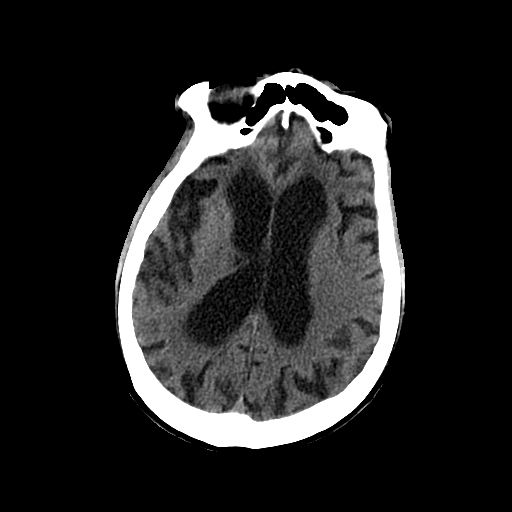
[im 20/36  bone]
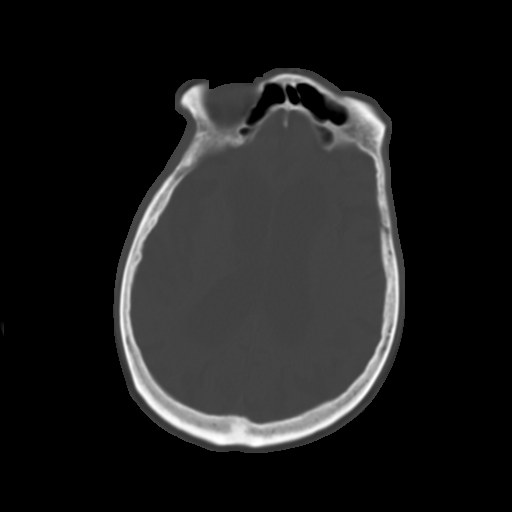
[im 24/36  brain]
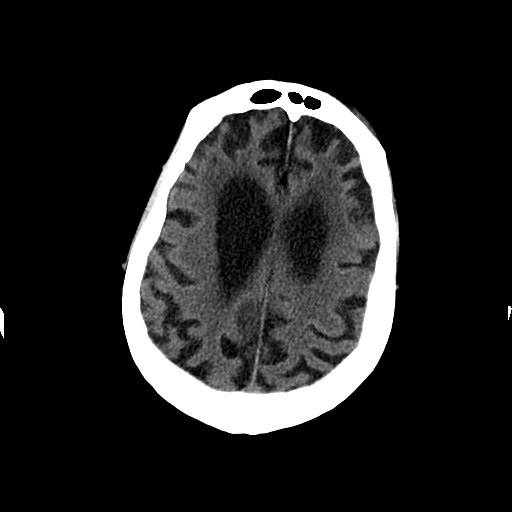
[im 28/36  brain]
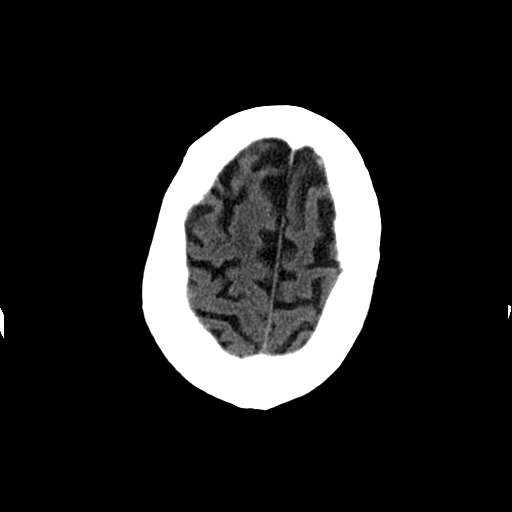
[im 32/36  brain]
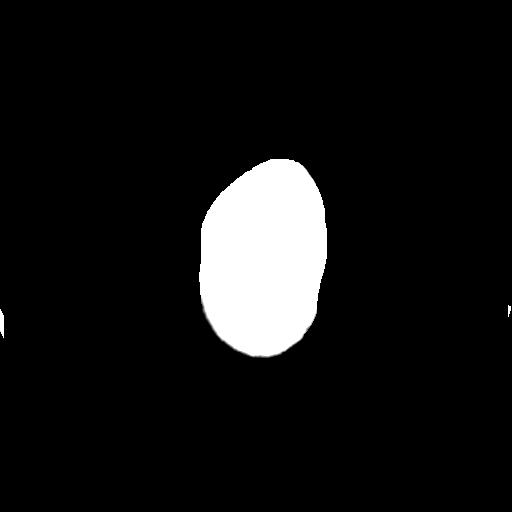

[Series 3: head w/o bone · axial · non-contrast · 0.49mm/px · z∈[+97,+235]mm · 8 of 71 slices shown]
[im 8/71  bone]
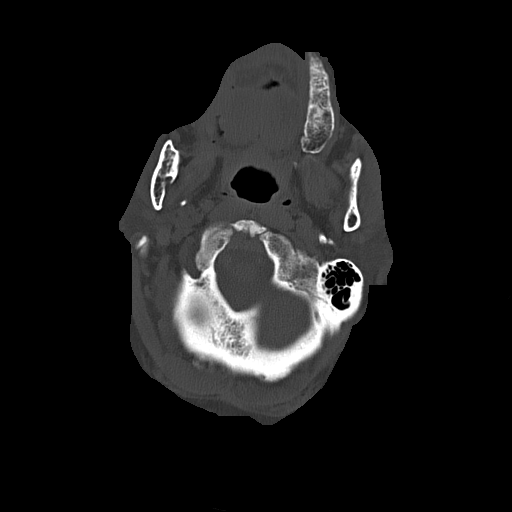
[im 15/71  bone]
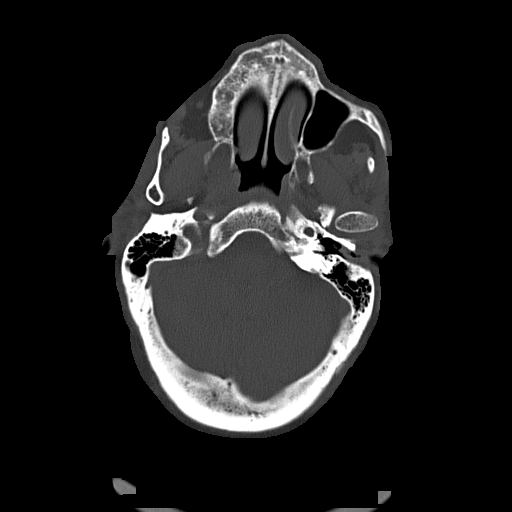
[im 23/71  bone]
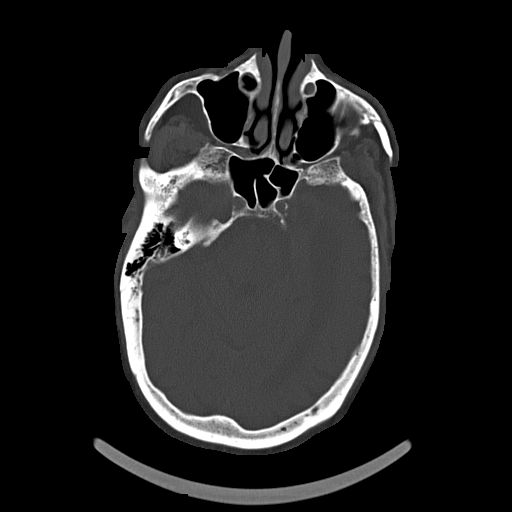
[im 30/71  bone]
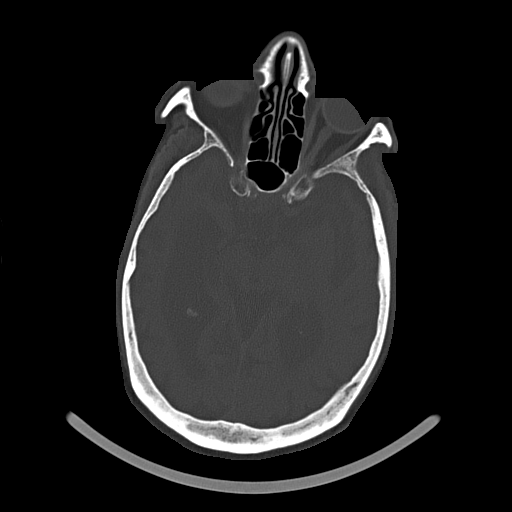
[im 41/71  bone]
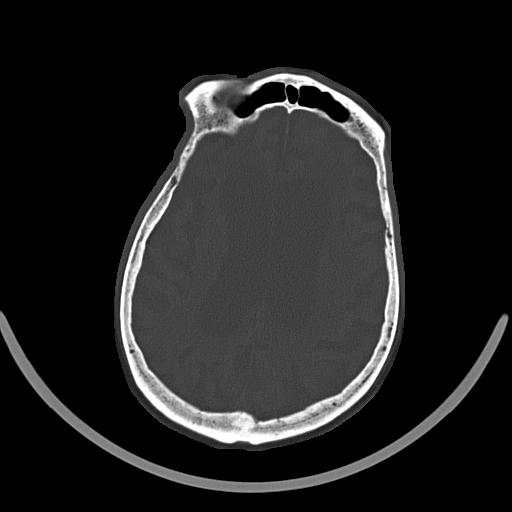
[im 48/71  bone]
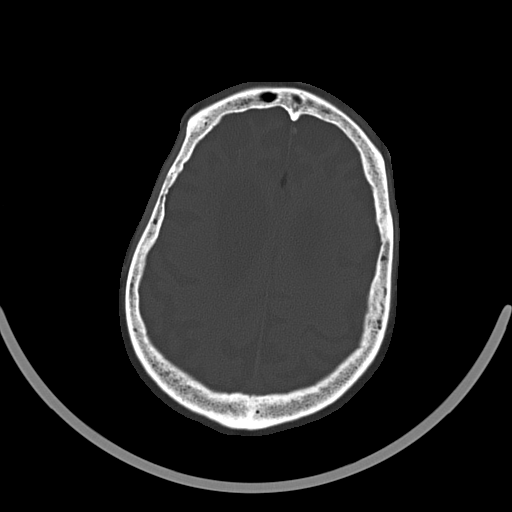
[im 56/71  bone]
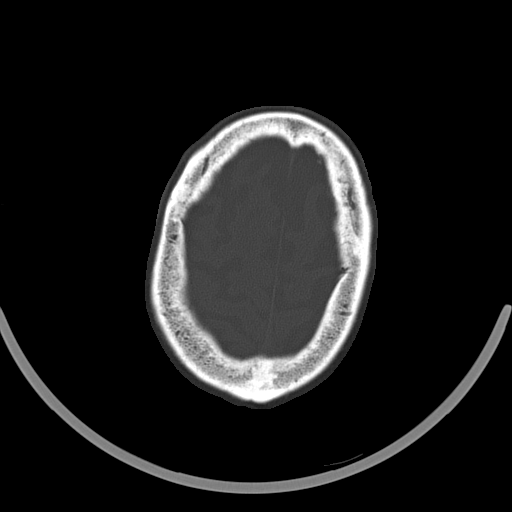
[im 63/71  bone]
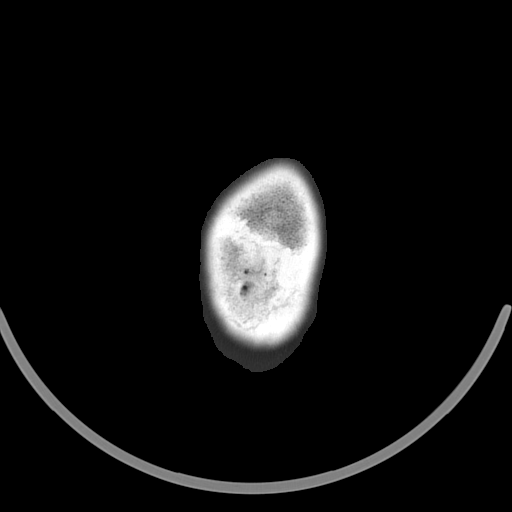

[16 of 30 positions shown; findings below may reference images not displayed]

FINDINGS: No evidence of parenchymal hemorrhage or extra-axial fluid
collection. No mass lesion, mass effect, or midline shift.

No CT evidence of acute infarction.

Subcortical white matter and periventricular small vessel ischemic
changes. Intracranial atherosclerosis.

Moderate to severe cortical and central atrophy with secondary
ventriculomegaly, increased from 9707.

The visualized paranasal sinuses are essentially clear. The mastoid
air cells are unopacified.

No evidence of calvarial fracture.
IMPRESSION: No evidence of acute intracranial abnormality.

Small vessel ischemic changes with intracranial atherosclerosis.

Moderate to severe atrophy with secondary ventriculomegaly,
increased from 9707.

## 2014-09-09 ENCOUNTER — Encounter: Payer: Self-pay | Admitting: Family Medicine

## 2014-09-09 LAB — HEMOGLOBIN A1C: HEMOGLOBIN A1C: 6.1 % — AB (ref <5.7)

## 2014-09-10 ENCOUNTER — Encounter: Payer: Medicare Other | Admitting: Cardiovascular Disease

## 2014-09-19 ENCOUNTER — Encounter: Payer: Self-pay | Admitting: Pharmacist

## 2014-10-09 ENCOUNTER — Encounter: Payer: Self-pay | Admitting: Cardiovascular Disease

## 2014-11-12 ENCOUNTER — Non-Acute Institutional Stay: Payer: Medicare Other | Admitting: Family Medicine

## 2014-11-12 DIAGNOSIS — R2243 Localized swelling, mass and lump, lower limb, bilateral: Secondary | ICD-10-CM | POA: Diagnosis not present

## 2014-11-12 DIAGNOSIS — E119 Type 2 diabetes mellitus without complications: Secondary | ICD-10-CM | POA: Diagnosis not present

## 2014-11-12 DIAGNOSIS — Z8679 Personal history of other diseases of the circulatory system: Secondary | ICD-10-CM | POA: Diagnosis not present

## 2014-11-12 DIAGNOSIS — M62838 Other muscle spasm: Secondary | ICD-10-CM

## 2014-11-12 DIAGNOSIS — M6249 Contracture of muscle, multiple sites: Secondary | ICD-10-CM | POA: Diagnosis not present

## 2014-11-12 NOTE — Progress Notes (Signed)
Nursing home Visit   HISTORY OF PRESENT ILLNESS: PAIGE MONARREZ is a 81 y.o.  female.  She is currently comfort care. She is non-verbal.   History Patient Active Problem List   Diagnosis Date Noted  . Cardiac pacemaker in situ 05/13/2014  . H/O sick sinus syndrome 05/13/2014  . 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 Stage II pressure ulcer of sacral region 08/21/2013  . Overweight (BMI 25.0-29.9) 06/22/2012  . Chronic kidney disease   . Iron deficiency anemia   . Glaucoma   . Immobility syndrome 01/20/2010  . Diabetes mellitus type 2, controlled 05/19/2009  . Person living in residential institution 10/15/2008  . PARKINSONISM 07/15/2008  . Alzheimer's disease 07/19/2006  . DEGENERATIVE JOINT DISEASE, KNEE 07/19/2006    Medications  Current outpatient prescriptions:  .  acetaminophen (TYLENOL) 325 MG tablet, Take 650 mg by mouth 2 (two) times daily. Don't exceed 4000mg  of acetaminophen in 24 hours., Disp: , Rfl:  .  baclofen (LIORESAL) 10 MG tablet, Take 1 tablet (10 mg total) by mouth 3 (three) times daily., Disp: , Rfl:  .  bisacodyl (DULCOLAX) 10 MG suppository, Place 10 mg rectally daily as needed for moderate constipation (If not relieved by Milk of Magnesium)., Disp: , Rfl:  .  Cholecalciferol (VITAMIN D3) 50000 UNITS CAPS, Take 50,000 Units by mouth every 30 (thirty) days. On the 15th of each month, Disp: , Rfl:  .  docusate (COLACE) 50 MG/5ML liquid, Take 100 mg by mouth daily., Disp: , Rfl:  .  latanoprost (XALATAN) 0.005 % ophthalmic solution, Place 1 drop into both eyes daily., Disp: , Rfl:  .  Multiple Vitamin (MULTIVITAMIN) tablet, Take 1 tablet by mouth daily.  , Disp: , Rfl:  .  senna (SENOKOT) 8.6 MG TABS tablet, Take 1 tablet by mouth every Monday, Wednesday, and Friday., Disp: , Rfl:  .  timolol (TIMOPTIC-XE) 0.5 % ophthalmic gel-forming, Place 1 drop into both eyes daily., Disp: , Rfl:  .  traMADol (ULTRAM) 50 MG tablet,  Take 50 mg by mouth every 6 (six) hours as needed., Disp: , Rfl:      Falls in the past six months: None    There were no vitals filed for this visit.  Wt Readings from Last 3 Encounters:  05/29/14 137 lb 9.6 oz (62.415 kg)  03/28/14 138 lb 9.6 oz (62.869 kg)  10/16/13 155 lb (70.308 kg)     Review of Systems:   General: Denies fevers, chills, weight loss, fatigue, weight gain.  Ears/Nose/Throat: Denies ear pain, throat pain, rhinorrhea, nasal congestion.  Cardiovascular: Denies complaints, chest pains, palpitations, dyspnea on exertion, orthopnea, peripheral edema.  Respiratory: Denies cough, sputum, dyspnea.  Gastrointestinal: Denies abd pain, bloating, constipation, diarrhea.  Genitourinary: Denies dysuria, urinary frequency, discharge.  Musculoskeletal: Denies joint pain, swelling, weakness.  Skin: Denies skin rash or ulcers. Psychiatric: Denies depression, anxiety. Endocrine: Denies weight change.   PHYSICAL EXAM:. General: No acute distress, in wheel chair HEENT:  No scleral icterus, no nasal secretions, [place] moist with no dentition Neck:  Supple, No JVD, no lymphadenopathy CV:  RRR, no murmur, no ankle swelling RESP: No resp distress or accessory muscle use.  Clear to ausc bilat. No wheezing, no rales, no rhonchi.  ABD:  Soft, Non-tender, non-distended, +bowel sounds, no masses MSK: Multiple contractures. Legs have nodules on them bilaterally over calf muscle that don't appear tender and appear related to direct pressure on the metal part of her wheel chair Skin:  No significant skin lesions or rash Neurologic:  Increased tone Psych: Orientation not able to be assessed   Assessment and Plan:    H/O sick sinus syndrome No changes. Patient's vitals appear to be stable.  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 Will need to revisit this to see if there is any benefit.  Diabetes mellitus type 2,  controlled Last A1C of 6.1 which is improved. Not currently being managed with diabetes medication.  Mass of both lower legs Appears secondary to patient's position in her wheel chair. Appears to be increased spasm. Discussed problem with nurse. Discussed providing patient with barriers for her wheel chair so her legs are not directly on the metal part of her wheel chair. Hoping this will improve her symptoms over time. Will watch for worsening symptoms. Do not think this is related to DVT with no leg swelling or erythema.     Code Status: DNR    Jacquelin Hawking, MD

## 2014-11-15 DIAGNOSIS — R2243 Localized swelling, mass and lump, lower limb, bilateral: Secondary | ICD-10-CM | POA: Insufficient documentation

## 2014-11-15 NOTE — Assessment & Plan Note (Signed)
No changes. Patient's vitals appear to be stable.

## 2014-11-15 NOTE — Assessment & Plan Note (Signed)
Appears secondary to patient's position in her wheel chair. Appears to be increased spasm. Discussed problem with nurse. Discussed providing patient with barriers for her wheel chair so her legs are not directly on the metal part of her wheel chair. Hoping this will improve her symptoms over time. Will watch for worsening symptoms. Do not think this is related to DVT with no leg swelling or erythema.

## 2014-11-15 NOTE — Assessment & Plan Note (Signed)
Last A1C of 6.1 which is improved. Not currently being managed with diabetes medication.

## 2014-11-15 NOTE — Assessment & Plan Note (Signed)
Patient still on Baclofen  TID. Will need to revisit this to see if there is any benefit.

## 2014-11-18 NOTE — Progress Notes (Signed)
[name], female   DOB: [birth year], 81 y.o.   MRN: [id number] I have seen and examined this patient. I have reviewed labs and imaging results.  I have discussed with Dr Caleb Popp.  I agree with their findings and plans as documented in their Regulatory visit note.

## 2014-12-02 ENCOUNTER — Encounter: Payer: Self-pay | Admitting: Pharmacist

## 2015-01-13 ENCOUNTER — Non-Acute Institutional Stay (INDEPENDENT_AMBULATORY_CARE_PROVIDER_SITE_OTHER): Payer: Medicare Other | Admitting: Family Medicine

## 2015-01-13 DIAGNOSIS — G20 Parkinson's disease: Secondary | ICD-10-CM

## 2015-01-13 DIAGNOSIS — G20A1 Parkinson's disease without dyskinesia, without mention of fluctuations: Secondary | ICD-10-CM

## 2015-01-13 NOTE — Progress Notes (Signed)
[name], female   DOB: 10/15/1928, 81 y.o.   MRN: [id number] This note was created in order to remove Ms [name] from [facility] census.

## 2015-02-09 ENCOUNTER — Encounter: Payer: Self-pay | Admitting: Gastroenterology

## 2015-03-03 ENCOUNTER — Encounter: Payer: Self-pay | Admitting: Cardiovascular Disease

## 2015-03-03 ENCOUNTER — Ambulatory Visit (INDEPENDENT_AMBULATORY_CARE_PROVIDER_SITE_OTHER): Payer: Medicare Other | Admitting: Cardiovascular Disease

## 2015-03-03 DIAGNOSIS — Z45010 Encounter for checking and testing of cardiac pacemaker pulse generator [battery]: Secondary | ICD-10-CM | POA: Diagnosis not present

## 2015-03-03 NOTE — Progress Notes (Signed)
[name], female   DOB: 07/15/1928, 81 y.o.   MRN: [id number]      Cardiology Office Note   Date:  03/03/2015   ID:  [name], DOB 09/24/1928, MRN [id number]  PCP:  Jacquelin Hawkingalph Nettey, MD  Cardiologist:    Thurmon FairROITORU,Matej Sappenfield, MD   No chief complaint on file.     History of Present Illness: [name] is a 81 y.o. female who presents for  pacemaker check   the patient cannot communicate and does not respond to questions or any verbal stimuli. She has generalized spasticity , advanced dementia and is completely noncommunicative.  She is not accompanied by any family members or by any representative of the nursing facility where she lives. History is obtained exclusively from the electronic record.  Cardiac history is limited to documents from 2005. At that time she had a normal coronary angiogram, normal nuclear stress test, normal echocardiogram and in March 2005 she underwent implantation of a dual-chamber [employer]onic and pulse E2 DR 01 pacemaker for "symptomatic bradycardia".   Attempts to interrogate her pacemaker failed. The device seems to have reached end of service. On the electrocardiogram there is no evidence of any pacemaker activity. The rhythm is sinus bradycardia at 58 bpm with a narrow QRS complex and 121 AV conduction  Past Medical History  Diagnosis Date  . Depression   . Chronic kidney disease   . Hypertension   . Parkinson's disease     5/08 psych consult with Dr. Barbarann EhlersWillford, transient dfelirium, mood disorder with insomnia. 5/10 CT  scan severe atrophy. Postural tremor.  . Diabetes type 2, controlled   . Anemia   . 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 Peripheral arterial disease   . DJD (degenerative joint disease)   . Glaucoma   . Helicobacter pylori (H. pylori) infection 12 2004     treated   . Trochanteric bursitis of left hip   . Carpal tunnel syndrome   . 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 H/O sick sinus syndrome 05/13/2014  . Stage I pressure ulcer of right heel 02/23/2014    Past Surgical History  Procedure Laterality Date  . Appendectomy  1944  . Tubal ligation    . Vaginal hysterectomy      For heavy bleeding   . Cataract extraction, bilateral  1990  . Cardaic cath    . Cardiac catheterization  03/2003    normal  . [facility]   . Oophorectomy  07/2003    for mucinous cyst adenoma   . Trabeculectomy  07/2006     Current Outpatient Prescriptions  Medication Sig Dispense Refill  . acetaminophen (TYLENOL) 325 MG tablet Take 650 mg by mouth 2 (two) times daily. Don't exceed 4000mg  of acetaminophen in 24 hours.    . baclofen (LIORESAL) 10 MG tablet Take 1 tablet (10 mg total) by mouth 3 (three) times daily.    . Cholecalciferol (VITAMIN D3) 50000 UNITS CAPS Take 50,000 Units by mouth every 30 (thirty) days. On the 15th of each month    . docusate (COLACE) 50 MG/5ML liquid Take 100 mg by mouth daily.    Marland Kitchen. latanoprost (XALATAN) 0.005 % ophthalmic solution Place 1 drop into both eyes daily.    . Multiple Vitamin (MULTIVITAMIN) tablet Take 1 tablet by mouth daily.      Marland Kitchen. senna (SENOKOT) 8.6 MG TABS tablet Take 1 tablet  by mouth every Monday, Wednesday, and Friday.    . timolol (TIMOPTIC-XE) 0.5 % ophthalmic gel-forming Place 1 drop into both eyes daily.    . traMADol (ULTRAM) 50 MG tablet Take 50 mg by mouth every 6 (six) hours as needed.     No current facility-administered medications for this visit.    Allergies:   Megace; Amlodipine besylate; Donepezil hydrochloride; and Penicillins    Social History:  The patient  reports that she has never smoked. She has quit using smokeless tobacco. Her smokeless tobacco use included Snuff. She reports that she does not drink alcohol or use illicit drugs.   Family History:  The patient's family history includes Alzheimer's disease in her mother; Breast cancer (age of onset: 82)  in her daughter; Colon cancer (age of onset: 72) in her daughter; Diabetes in her father and sister; Hypertension in her mother.    ROS:  Please see the history of present illness.     review of systems could not be obtained due to patient factors   EKG:  EKG is ordered today. The ekg ordered today demonstrates  Sinus bradycardia at 57 bpm with normal one-to-one AV conduction and normal QRS complex, no repolarization abnormalities. Only limb leads could be obtained due to the patient's contractures   Recent Labs: 03/03/2014: BUN 16; Creatinine 0.8; Potassium 3.7; Sodium 139    Lipid Panel    Component Value Date/Time   LDLDIRECT 139* 10/16/2007 2049      Wt Readings from Last 3 Encounters:  05/29/14 137 lb 9.6 oz (62.415 kg)  03/28/14 138 lb 9.6 oz (62.869 kg)  10/16/13 155 lb (70.308 kg)      Other studies Reviewed: Additional studies/ records that were reviewed today include:  Extensive review of the electronic medical record including reports of her echocardiogram, coronary angiogram, pacemaker implantation and few available progress notes from the remote past when she saw Dr. Elsie Lincoln and Dr. Jenne Campus.   ASSESSMENT AND PLAN:  1.  Mild sinus bradycardia 2. Dual-chamber permanent pacemaker at end of service, completely nonoperational 3.  Advanced dementia and generalized spasticity due to parkinsonism , complicated by sacral pressure ulcer   She does not appear to require pacemaker therapy , from the limited evaluation that I could perform today. I would not recommend replacement of her pacemaker generator. The chart states that goals of care are "Comfort Care only". No further cardiology evaluation appears to be necessary    Current medicines could not be reviewed with the patient today.  Labs/ tests ordered today include:  No orders of the defined types were placed in this encounter.     Joie Bimler, MD  03/03/2015 9:17 AM    Thurmon Fair, MD, [facility]  [facility] [phone] office [phone] pager

## 2015-03-04 NOTE — Addendum Note (Signed)
Addended by: Ronnell GuadalajaraLASSITER, Kelsy Polack A on: 03/04/2015 08:13 AM   Modules accepted: Orders

## 2015-09-26 DEATH — deceased
